# Patient Record
Sex: Male | Born: 1940 | Hispanic: No | Marital: Single | Smoking: Former smoker
Health system: Southern US, Community
[De-identification: ages and names within clinical notes are randomized; demographics above are authoritative.]

## PROBLEM LIST (undated history)

## (undated) DIAGNOSIS — E785 Hyperlipidemia, unspecified: Secondary | ICD-10-CM

## (undated) HISTORY — PX: INTRACAPSULAR CATARACT EXTRACTION: SHX361

---

## 2014-02-20 LAB — BASIC METABOLIC PANEL
BUN: 17 mg/dL (ref 4–21)
Creatinine: 1 mg/dL (ref <=1.3)

## 2014-02-20 LAB — LIPID PANEL
Cholesterol: 234 mg/dL — AB (ref 0–200)
HDL: 71 mg/dL — AB (ref 35–70)
LDL Cholesterol: 124 mg/dL
Triglycerides: 193 mg/dL — AB (ref 40–160)

## 2014-03-13 ENCOUNTER — Ambulatory Visit: Payer: Self-pay

## 2014-09-17 ENCOUNTER — Encounter: Payer: Self-pay | Admitting: Internal Medicine

## 2014-09-17 DIAGNOSIS — M19079 Primary osteoarthritis, unspecified ankle and foot: Secondary | ICD-10-CM | POA: Insufficient documentation

## 2014-09-17 DIAGNOSIS — E782 Mixed hyperlipidemia: Secondary | ICD-10-CM | POA: Insufficient documentation

## 2014-09-17 DIAGNOSIS — F17200 Nicotine dependence, unspecified, uncomplicated: Secondary | ICD-10-CM | POA: Insufficient documentation

## 2014-09-17 DIAGNOSIS — K589 Irritable bowel syndrome without diarrhea: Secondary | ICD-10-CM | POA: Insufficient documentation

## 2014-09-20 ENCOUNTER — Other Ambulatory Visit: Payer: Self-pay | Admitting: Internal Medicine

## 2014-09-20 DIAGNOSIS — R1011 Right upper quadrant pain: Secondary | ICD-10-CM

## 2014-09-26 ENCOUNTER — Ambulatory Visit: Payer: Self-pay

## 2014-10-03 ENCOUNTER — Ambulatory Visit: Payer: Medicaid Other

## 2014-10-10 ENCOUNTER — Ambulatory Visit
Admission: RE | Admit: 2014-10-10 | Discharge: 2014-10-10 | Disposition: A | Discharge status: Home / Self Care | Payer: Medicaid Other | Source: Ambulatory Visit | Attending: Internal Medicine | Admitting: Internal Medicine

## 2014-10-10 DIAGNOSIS — N200 Calculus of kidney: Secondary | ICD-10-CM | POA: Diagnosis not present

## 2014-10-10 DIAGNOSIS — R1011 Right upper quadrant pain: Secondary | ICD-10-CM

## 2014-10-10 DIAGNOSIS — K760 Fatty (change of) liver, not elsewhere classified: Secondary | ICD-10-CM | POA: Diagnosis not present

## 2015-01-29 ENCOUNTER — Other Ambulatory Visit: Payer: Self-pay | Admitting: Internal Medicine

## 2015-03-09 ENCOUNTER — Other Ambulatory Visit: Payer: Self-pay | Admitting: Internal Medicine

## 2015-03-23 ENCOUNTER — Ambulatory Visit (INDEPENDENT_AMBULATORY_CARE_PROVIDER_SITE_OTHER): Payer: Medicaid Other | Admitting: Internal Medicine

## 2015-03-23 ENCOUNTER — Encounter: Payer: Self-pay | Admitting: Internal Medicine

## 2015-03-23 VITALS — BP 128/80 | HR 68 | Ht 65.0 in | Wt 162.8 lb

## 2015-03-23 DIAGNOSIS — M19072 Primary osteoarthritis, left ankle and foot: Secondary | ICD-10-CM

## 2015-03-23 DIAGNOSIS — K589 Irritable bowel syndrome without diarrhea: Secondary | ICD-10-CM

## 2015-03-23 DIAGNOSIS — J302 Other seasonal allergic rhinitis: Secondary | ICD-10-CM | POA: Diagnosis not present

## 2015-03-23 DIAGNOSIS — G44219 Episodic tension-type headache, not intractable: Secondary | ICD-10-CM | POA: Diagnosis not present

## 2015-03-23 DIAGNOSIS — Z125 Encounter for screening for malignant neoplasm of prostate: Secondary | ICD-10-CM | POA: Diagnosis not present

## 2015-03-23 DIAGNOSIS — E782 Mixed hyperlipidemia: Secondary | ICD-10-CM

## 2015-03-23 DIAGNOSIS — F17200 Nicotine dependence, unspecified, uncomplicated: Secondary | ICD-10-CM | POA: Diagnosis not present

## 2015-03-23 MED ORDER — PRAVASTATIN SODIUM 40 MG PO TABS: 40.0000 mg | ORAL_TABLET | Freq: Every day | ORAL | Status: DC

## 2015-03-23 MED ORDER — CETIRIZINE HCL 10 MG PO TABS: 10.0000 mg | ORAL_TABLET | Freq: Every day | ORAL | Status: DC

## 2015-03-23 NOTE — Progress Notes (Signed)
Date:  03/23/2015   Name:  Jeffrey Gill   DOB:  05/03/1941   MRN:  [id number]   Chief Complaint: Hyperlipidemia Hyperlipidemia This is a chronic problem. The current episode started more than 1 year ago. The problem is controlled. Recent lipid tests were reviewed and are normal. There are no known factors aggravating his hyperlipidemia. Pertinent negatives include no chest pain or shortness of breath. Current antihyperlipidemic treatment includes statins. The current treatment provides significant improvement of lipids. There are no compliance problems.   Ankle Pain  The incident occurred more than 1 week ago. The injury mechanism was a twisting injury. The quality of the pain is described as aching. The pain has been intermittent since onset. Pertinent negatives include no numbness. The symptoms are aggravated by weight bearing.  Allergic rhinitis - He complains of intermittent runny nose that he says his allergies. Symptoms are usually worse in the spring and fall. He uses over-the-counter Zantac but is requesting a prescription since he has Medicaid. Headache - patient describes intermittent temporal headache that is relieved by Tylenol. He's never had headaches in the past and these are not severe. They started gradually and then get slowly worse. He's had no persistent headache, nausea, visual change, or dizziness. Flank pain - Patient describes right sided muscular-type discomfort. It started after he lifted a heavy trunk several months ago. It is not painful to palpation and he denies any change in his urine with regard to color or blood. He has not taken any specific therapy for this. Irritable bowel syndrome - patient takes Levsin as needed for IBS symptoms. He denies vomiting, diarrhea, or blood in his stool. His appetite is stable.  Review of Systems  Constitutional: Negative for fever, chills and fatigue.  HENT: Positive for postnasal drip, rhinorrhea and sinus pressure.  Negative for sore throat and trouble swallowing.   Eyes: Negative for visual disturbance.  Respiratory: Negative for cough, chest tightness and shortness of breath.   Cardiovascular: Negative for chest pain, palpitations and leg swelling.  Gastrointestinal: Positive for abdominal pain (intermittent IBS).  Genitourinary: Negative for dysuria, urgency, frequency and hematuria.  Musculoskeletal: Positive for back pain (right flank pain).  Neurological: Positive for headaches (tension type). Negative for speech difficulty, weakness, light-headedness and numbness.  Hematological: Negative for adenopathy.  Psychiatric/Behavioral: Negative for confusion.    Patient Active Problem List   Diagnosis Date Noted  . 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 Hyperlipidemia, mixed 09/17/2014  . 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 Compulsive tobacco user syndrome 09/17/2014    Prior to Admission medications   Medication Sig Start Date End Date Taking? Authorizing Provider  hyoscyamine (ANASPAZ) 0.125 MG TBDP disintergrating tablet Take 1 tablet by mouth 2 (two) times daily as needed.   Yes Historical Provider, MD  Multiple Vitamins-Minerals (MULTIVITAMIN ADULTS 50+) TABS Take 1 tablet by mouth daily.   Yes Historical Provider, MD  Omega-3 Fatty Acids (FISH OIL BURP-LESS) 1200 MG CAPS Take 1 capsule by mouth daily.   Yes Historical Provider, MD  pravastatin (PRAVACHOL) 40 MG tablet TAKE 1 TABLET BY MOUTH EVERY DAY. 03/10/15  Yes Reubin MilanLaura H Detra Bores, MD  etodolac (LODINE) 500 MG tablet Take 1 tablet by mouth daily.    Historical Provider, MD    No Known Allergies  Past Surgical History  Procedure Laterality Date  . Intracapsular cataract extraction Bilateral     Social History  Substance Use Topics  . Smoking status: Current Every Day Smoker  .  Smokeless tobacco: None  . Alcohol Use: 0.0 oz/week    0 Standard drinks or equivalent per week     Medication list has been  reviewed and updated.   Physical Exam  Constitutional: He is oriented to person, place, and time. He appears well-developed. No distress.  HENT:  Head: Normocephalic and atraumatic.  Right Ear: Tympanic membrane and ear canal normal.  Left Ear: Tympanic membrane and ear canal normal.  Nose: Right sinus exhibits no maxillary sinus tenderness and no frontal sinus tenderness. Left sinus exhibits no maxillary sinus tenderness and no frontal sinus tenderness.  Mouth/Throat: Oropharynx is clear and moist.  Eyes: Conjunctivae are normal. Right eye exhibits no discharge. Left eye exhibits no discharge. No scleral icterus.  Neck: Normal range of motion. Neck supple. Carotid bruit is not present. No thyromegaly present.  Cardiovascular: Normal rate, regular rhythm, normal heart sounds and normal pulses.   Pulmonary/Chest: Effort normal and breath sounds normal. No respiratory distress.  Abdominal: Soft. Normal appearance and bowel sounds are normal. There is no tenderness. There is CVA tenderness (tender over the right flank - muscular type discomfort with movement).  Musculoskeletal: Normal range of motion.  No temporal tenderness or cord noted  Lymphadenopathy:    He has no cervical adenopathy.  Neurological: He is alert and oriented to person, place, and time.  Skin: Skin is warm and dry. No rash noted.  Psychiatric: He has a normal mood and affect. His behavior is normal. Thought content normal.  Nursing note and vitals reviewed.   BP 128/80 mmHg  Pulse 68  Ht  (1.651 m)  Wt 162 lb 12.8 oz (73.846 kg)  BMI 27.09 kg/m2  Assessment and Plan: 1. Hyperlipidemia, mixed Continue current therapy - pravastatin (PRAVACHOL) 40 MG tablet; Take 1 tablet (40 mg total) by mouth daily.  Dispense: 30 tablet; Refill: 5 - Comprehensive metabolic panel - Lipid panel  2. 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 Osteoarthritis of left ankle, unspecified osteoarthritis type Tylenol as needed  4. Compulsive tobacco user syndrome he continues to smoke a half a pack of cigarettes per day and has no interest in quitting  5. Other seasonal allergic rhinitis Continue Zyrtec when necessary - cetirizine (ZYRTEC) 10 MG tablet; Take 1 tablet (10 mg total) by mouth daily.  Dispense: 30 tablet; Refill: 11  6. Prostate cancer screening DRE deferred - PSA  7. Episodic tension-type headache, not intractable Recommended Tylenol as needed Return for further evaluation if persistent   Bari Edward, MD [facility]  03/23/2015

## 2015-03-24 LAB — COMPREHENSIVE METABOLIC PANEL
ALT: 22 IU/L (ref 0–44)
AST: 24 IU/L (ref 0–40)
Albumin/Globulin Ratio: 1.7 (ref 1.1–2.5)
Albumin: 4.3 g/dL (ref 3.5–4.8)
Alkaline Phosphatase: 66 IU/L (ref 39–117)
BILIRUBIN TOTAL: 0.5 mg/dL (ref 0.0–1.2)
BUN / CREAT RATIO: 12 (ref 10–22)
BUN: 14 mg/dL (ref 8–27)
CHLORIDE: 101 mmol/L (ref 97–106)
CO2: 24 mmol/L (ref 18–29)
Calcium: 9.4 mg/dL (ref 8.6–10.2)
Creatinine, Ser: 1.13 mg/dL (ref 0.76–1.27)
GFR calc Af Amer: 75 mL/min/{1.73_m2} (ref >59)
GFR calc non Af Amer: 65 mL/min/{1.73_m2} (ref >59)
GLUCOSE: 91 mg/dL (ref 65–99)
Globulin, Total: 2.6 g/dL (ref 1.5–4.5)
Potassium: 4.6 mmol/L (ref 3.5–5.2)
Sodium: 141 mmol/L (ref 136–144)
Total Protein: 6.9 g/dL (ref 6.0–8.5)

## 2015-03-24 LAB — CBC WITH DIFFERENTIAL/PLATELET
Basophils Absolute: 0 10*3/uL (ref 0.0–0.2)
Basos: 1 %
EOS (ABSOLUTE): 0.2 10*3/uL (ref 0.0–0.4)
EOS: 4 %
HEMATOCRIT: 42.8 % (ref 37.5–51.0)
Hemoglobin: 14.4 g/dL (ref 12.6–17.7)
Immature Grans (Abs): 0 10*3/uL (ref 0.0–0.1)
Immature Granulocytes: 0 %
LYMPHS ABS: 2.1 10*3/uL (ref 0.7–3.1)
Lymphs: 34 %
MCH: 33 pg (ref 26.6–33.0)
MCHC: 33.6 g/dL (ref 31.5–35.7)
MCV: 98 fL — ABNORMAL HIGH (ref 79–97)
MONOS ABS: 0.3 10*3/uL (ref 0.1–0.9)
Monocytes: 5 %
Neutrophils Absolute: 3.5 10*3/uL (ref 1.4–7.0)
Neutrophils: 56 %
PLATELETS: 189 10*3/uL (ref 150–379)
RBC: 4.37 x10E6/uL (ref 4.14–5.80)
RDW: 14.3 % (ref 12.3–15.4)
WBC: 6.2 10*3/uL (ref 3.4–10.8)

## 2015-03-24 LAB — LIPID PANEL
CHOLESTEROL TOTAL: 229 mg/dL — AB (ref 100–199)
Chol/HDL Ratio: 4 ratio units (ref 0.0–5.0)
HDL: 57 mg/dL (ref >39)
LDL Calculated: 123 mg/dL — ABNORMAL HIGH (ref 0–99)
TRIGLYCERIDES: 247 mg/dL — AB (ref 0–149)
VLDL CHOLESTEROL CAL: 49 mg/dL — AB (ref 5–40)

## 2015-03-24 LAB — PSA: Prostate Specific Ag, Serum: 2.2 ng/mL (ref 0.0–4.0)

## 2015-07-19 ENCOUNTER — Encounter: Payer: Self-pay | Admitting: Family Medicine

## 2015-07-19 ENCOUNTER — Ambulatory Visit (INDEPENDENT_AMBULATORY_CARE_PROVIDER_SITE_OTHER): Payer: Medicaid Other | Admitting: Family Medicine

## 2015-07-19 VITALS — BP 130/80 | HR 72 | Ht 65.0 in | Wt 166.0 lb

## 2015-07-19 DIAGNOSIS — M779 Enthesopathy, unspecified: Principal | ICD-10-CM

## 2015-07-19 DIAGNOSIS — M778 Other enthesopathies, not elsewhere classified: Secondary | ICD-10-CM | POA: Diagnosis not present

## 2015-07-19 MED ORDER — ETODOLAC 500 MG PO TABS: 500.0000 mg | ORAL_TABLET | Freq: Two times a day (BID) | ORAL | Status: DC

## 2015-07-19 NOTE — Progress Notes (Signed)
Name: Jeffrey Gill   MRN: [id number]    DOB: 1941-03-18   Date:07/19/2015       Progress Note  Subjective  Chief Complaint  Chief Complaint  Patient presents with  . Arm Pain    R) arm pain- been hurting for approx a week- worse last night, unable to sleep- feels like a spasm on back side of arm    Arm Pain  The incident occurred more than 1 week ago (no injury). The quality of the pain is described as aching. The pain is at a severity of 6/10. The pain has been fluctuating since the incident. Pertinent negatives include no chest pain or tingling.    No problem-specific assessment & plan notes found for this encounter.   History reviewed. No pertinent past medical history.  Past Surgical History  Procedure Laterality Date  . Intracapsular cataract extraction Bilateral     History reviewed. No pertinent family history.  Social History   Social History  . Marital Status: Single    Spouse Name: N/A  . Number of Children: N/A  . Years of Education: N/A   Occupational History  . Not on file.   Social History Main Topics  . Smoking status: Current Every Day Smoker  . Smokeless tobacco: Not on file  . Alcohol Use: 0.0 oz/week    0 Standard drinks or equivalent per week  . Drug Use: No  . Sexual Activity: Not on file   Other Topics Concern  . Not on file   Social History Narrative    No Known Allergies   Review of Systems  Constitutional: Negative for fever, chills, weight loss and malaise/fatigue.  HENT: Negative for ear discharge, ear pain and sore throat.   Eyes: Negative for blurred vision.  Respiratory: Negative for cough, sputum production, shortness of breath and wheezing.   Cardiovascular: Negative for chest pain, palpitations and leg swelling.  Gastrointestinal: Negative for heartburn, nausea, abdominal pain, diarrhea, constipation, blood in stool and melena.  Genitourinary: Negative for dysuria, urgency, frequency and hematuria.   Musculoskeletal: Negative for myalgias, back pain, joint pain and neck pain.  Skin: Negative for rash.  Neurological: Negative for dizziness, tingling, sensory change, focal weakness and headaches.  Endo/Heme/Allergies: Negative for environmental allergies and polydipsia. Does not bruise/bleed easily.  Psychiatric/Behavioral: Negative for depression and suicidal ideas. The patient is not nervous/anxious and does not have insomnia.      Objective  Filed Vitals:   07/19/15 1102  BP: 130/80  Pulse: 72  Height:  (1.651 m)  Weight: 166 lb (75.297 kg)    Physical Exam  Constitutional: He is oriented to person, place, and time and well-developed, well-nourished, and in no distress.  HENT:  Head: Normocephalic.  Right Ear: External ear normal.  Left Ear: External ear normal.  Nose: Nose normal.  Mouth/Throat: Oropharynx is clear and moist.  Eyes: Conjunctivae and EOM are normal. Pupils are equal, round, and reactive to light. Right eye exhibits no discharge. Left eye exhibits no discharge. No scleral icterus.  Neck: Normal range of motion. Neck supple. No JVD present. No tracheal deviation present. No thyromegaly present.  Cardiovascular: Normal rate, regular rhythm, normal heart sounds and intact distal pulses.  Exam reveals no gallop and no friction rub.   No murmur heard. Pulmonary/Chest: Breath sounds normal. No respiratory distress. He has no wheezes. He has no rales.  Abdominal: Soft. Bowel sounds are normal. He exhibits no mass. There is no hepatosplenomegaly. There is no tenderness.  There is no rebound, no guarding and no CVA tenderness.  Musculoskeletal: Normal range of motion. He exhibits no edema or tenderness.  Lymphadenopathy:    He has no cervical adenopathy.  Neurological: He is alert and oriented to person, place, and time. He has normal sensation, normal strength, normal reflexes and intact cranial nerves. No cranial nerve deficit.  Skin: Skin is warm. No rash  noted.  Psychiatric: Mood and affect normal.  Nursing note and vitals reviewed.     Assessment & Plan  Problem List Items Addressed This Visit    None    Visit Diagnoses    Triceps tendonitis    -  Primary    possible partial tear    Relevant Medications    etodolac (LODINE) 500 MG tablet    Other Relevant Orders    Ambulatory referral to Physical Therapy         Dr. Elizabeth Sauereanna [facility]icy [facility]  07/19/2015

## 2015-07-19 NOTE — Patient Instructions (Addendum)
Triceps Tendinitis with Rehab Triceps tendinitis usually results in a ligament sprain (tear). The triceps tendon attaches the elbow to the triceps muscle on the back of the arm. It prevents the elbow from bending too far outward. Sprains are classified into three categories. Grade 1 sprains cause pain, but the tendon is not lengthened. Grade 2 sprains include a lengthened ligament due to the ligament being stretched or partially ruptured. With grade 2 sprains there is still function, although the function may be diminished. Grade 3 sprains are characterized by a complete tear of the tendon or muscle and function is usually impaired. SYMPTOMS   Pain, tenderness, swelling, and/or bruising over the site of injury (contusion).  Pain that worsens with elbow movement, such as push-ups.  A "pop" or tear felt or heard at the time of injury.  Decreased elbow function and/or grip strength. CAUSES   Overuse of triceps muscles and tendons.  Injury, laceration or direct blow to the triceps tendon. RISK INCREASES WITH:  Activities in which falling is likely.  Weightlifting and push-ups.  Poor strength and flexibility.  Steroid use. PREVENTION  Warm up and stretch properly before activity.  Maintain physical fitness:  Strength, flexibility, and endurance.  Cardiovascular fitness.  Learn and use proper technique. When possible, have coach correct improper technique.  Functional braces may be effective in preventing injury, especially re-injury, in contact sports. PROGNOSIS  Triceps sprains usually heal in 6 weeks with proper treatment and rest. RELATED COMPLICATIONS  Tendon rupture requiring surgery.  Loss of motion in the elbow  Prolonged healing time, if improperly treated or re-injured. TREATMENT  Treatment initially involves resting from any activities that aggravate the symptoms, and the use of ice and medications to help reduce pain and inflammation. Referral to a therapist for  further evaluation and treatment may be enough for a full recovery. If rehabilitation alone is insufficient for resolving the injury, then surgery may be necessary to use other tissue to recreate (reconstruct) the torn tendon. After surgery, immobilization of the elbow is necessary to allow for healing. After immobilization it is important to perform strengthening and stretching exercises to help regain strength and a full range of motion. These exercises may be completed at home or with a therapist. Your therapist will decide when you may return to sports. MEDICATION   If pain medication is necessary, then nonsteroidal anti-inflammatory medications, such as aspirin and ibuprofen, or other minor pain relievers, such as acetaminophen, are often recommended.  Do not take pain medication for 7 days before surgery.  Prescription pain relievers may be given if deemed necessary by your caregiver. Use only as directed and only as much as you need. HEAT AND COLD  Cold treatment (icing) relieves pain and reduces inflammation. Cold treatment should be applied for 10 to 15 minutes every 2 to 3 hours for inflammation and pain and immediately after any activity that aggravates your symptoms. Use ice packs or massage the area with a piece of ice (ice massage).  Heat treatment may be used prior to performing the stretching and strengthening activities prescribed by your caregiver, physical therapist, or athletic trainer. Use a heat pack or soak your injury in warm water. SEEK MEDICAL CARE IF:  Treatment seems to offer no benefit, or the condition worsens.  Any medications produce adverse side effects.  Any complications from surgery occur:  Pain, numbness, or coldness in the extremity operated upon.  Discoloration of the nail beds (they become blue or gray) of the extremity operated upon.  Signs of infections (fever, pain, inflammation, redness, or persistent bleeding). EXERCISES RANGE OF MOTION (ROM)  AND STRETCHING EXERCISES - Triceps Tendinitis These exercises may help you when beginning to rehabilitate your injury. Your symptoms may resolve with or without further involvement from your physician, physical therapist or athletic trainer. While completing these exercises, remember:   Restoring tissue flexibility helps normal motion to return to the joints. This allows healthier, less painful movement and activity.  An effective stretch should be held for at least 30 seconds.  A stretch should never be painful. You should only feel a gentle lengthening or release in the stretched tissue. RANGE OF MOTION - Flexion  Hold your right / left arm at your side and bend your elbow as far as you can using your right / left arm muscles.  Bend the right / left elbow farther by gently pushing up on your forearm until you feel a gentle stretch on the outside of your elbow. Hold this position for __________ seconds.  Slowly return to the starting position. Repeat __________ times. Complete this exercise __________ times per day.  RANGE OF MOTION - Elbow Flexion, Supine   Lie on your back. Extend your right / left arm into the air, bracing it with your opposite hand. Allow your right / left arm to relax.  Let your elbow bend, allowing your hand fall slowly toward your chest.  You should feel a gentle stretch along the back of your upper arm and/or elbow. Your physician, physical therapist or athletic trainer may ask you to hold a __________ hand weight to increase the intensity of this stretch.  Hold for __________ seconds. Slowly return your right / left arm to the upright position. Repeat __________ times. Complete this exercise __________ times per day. STRETCH - Elbow Flexors   Lie on a firm bed or countertop on your back. Be sure that you are in a comfortable position which will allow you to relax your arm muscles.  Place a folded towel under your upper arm so that your elbow and shoulder are  at the same height. Extend your arm; your elbow should not rest on the bed or towel  Allow the weight of your hand to straighten your elbow. Keep your arm and chest muscles relaxed. Your caretaker may ask you to increase the intensity of your stretch by adding a small wrist or hand weight.  Hold for __________ seconds. You should feel a stretch on the inside of your elbow. Slowly return to the starting position. Repeat __________ times. Complete this exercise __________ times per day. STRENGTHENING EXERCISES - Triceps Tendinitis These exercises will help you regain your strength. These exercises may resolve your symptoms with or without further involvement from your physician, physical therapist or athletic trainer. While completing these exercises, remember:   Muscles can gain both the endurance and the strength needed for everyday activities through controlled exercises.  Complete these exercises as instructed by your physician, physical therapist or athletic trainer. Progress with the resistance and repetition exercises only as your caregiver advises.  You may experience muscle soreness or fatigue, but the pain or discomfort you are trying to eliminate should never worsen during these exercises. If this pain does worsen, stop and make certain you are following the directions exactly. If the pain is still present after adjustments, discontinue the exercise until you can discuss the trouble with your clinician. STRENGTH - Elbow Extensors, Isometric  Stand or sit upright on a firm surface. Place your right /  left arm so that your palm faces your abdomen and it is at the height of your waist.  Place your opposite hand on the underside of your forearm. Gently push up as your right / left arm resists. Push as hard as you can with both arms without causing any pain or movement at your right / left elbow. Hold this stationary position for __________ seconds.  Gradually release the tension in both  arms. Allow your muscles to relax completely before repeating. Repeat __________ times. Complete this exercise __________ times per day. STRENGTH - Elbow Flexors, Supinated  With good posture, stand or sit on a firm chair without armrests. Allow your right / left arm to rest at your side with your palm facing forward.  Holding a __________ weight or gripping a rubber exercise band/tubing, bring your hand toward your shoulder.  Allow your muscles to control the resistance as your hand returns to your side. Repeat __________ times. Complete this exercise __________ times per day.  STRENGTH - Elbow Flexors, Neutral  With good posture, stand or sit on a firm chair without armrests. Allow your right / left arm to rest at your side with your thumb facing forward.  Holding a __________ weight or gripping a rubber exercise band/tubing, bring your hand toward your shoulder.  Allow your muscles to control the resistance as your hand returns to your side. Repeat __________ times. Complete this exercise __________ times per day.  STRENGTH - Elbow Extensors  Lie on your back. Extend your right / left elbow into the air, pointing it toward the ceiling. Brace your arm with your opposite hand.*  Holding a __________ weight in your hand, slowly straighten your right / left elbow.  Allow your muscles to control the weight as your hand returns to its starting position. Repeat __________ times. Complete this exercise __________ times per day. *You may also stand with your elbow overhead and pointed toward the ceiling and supported by your opposite hand. STRENGTH - Elbow Extensors, Dynamic  With good posture, stand or sit on a firm chair without armrests. Keeping your upper arms at your side, bring both hands up to your right / left shoulder while gripping a rubber exercise band/tubing. Your right / left hand should be just below the other hand.  Straighten your right / left elbow. Hold for __________  seconds.  Allow your muscles to control the rubber exercise band/tubing as your hand returns to your shoulder. Repeat __________ times. Complete this exercise __________ times per day.   This information is not intended to replace advice given to you by your health care provider. Make sure you discuss any questions you have with your health care provider.   Document Released: 04/21/2005 Document Revised: 07/14/2011 Document Reviewed: 08/03/2008 [employer] Interactive Patient Education [employer]

## 2015-07-20 ENCOUNTER — Other Ambulatory Visit: Payer: Self-pay

## 2015-07-20 MED ORDER — IBUPROFEN 600 MG PO TABS: 600.0000 mg | ORAL_TABLET | Freq: Three times a day (TID) | ORAL | Status: DC | PRN

## 2015-08-16 ENCOUNTER — Ambulatory Visit (INDEPENDENT_AMBULATORY_CARE_PROVIDER_SITE_OTHER): Payer: Medicaid Other | Admitting: Internal Medicine

## 2015-08-16 ENCOUNTER — Encounter: Payer: Self-pay | Admitting: Internal Medicine

## 2015-08-16 ENCOUNTER — Other Ambulatory Visit: Payer: Self-pay | Admitting: Internal Medicine

## 2015-08-16 ENCOUNTER — Ambulatory Visit: Payer: Medicaid Other | Attending: Family Medicine

## 2015-08-16 ENCOUNTER — Encounter: Payer: Self-pay | Admitting: Physical Therapy

## 2015-08-16 VITALS — BP 162/74 | HR 76 | Temp 98.3°F | Resp 14 | Wt 163.0 lb

## 2015-08-16 DIAGNOSIS — M79621 Pain in right upper arm: Secondary | ICD-10-CM | POA: Diagnosis present

## 2015-08-16 DIAGNOSIS — M62838 Other muscle spasm: Secondary | ICD-10-CM | POA: Diagnosis not present

## 2015-08-16 DIAGNOSIS — R293 Abnormal posture: Secondary | ICD-10-CM | POA: Insufficient documentation

## 2015-08-16 DIAGNOSIS — M779 Enthesopathy, unspecified: Secondary | ICD-10-CM | POA: Diagnosis not present

## 2015-08-16 DIAGNOSIS — M6281 Muscle weakness (generalized): Secondary | ICD-10-CM | POA: Diagnosis not present

## 2015-08-16 DIAGNOSIS — M25511 Pain in right shoulder: Secondary | ICD-10-CM | POA: Insufficient documentation

## 2015-08-16 MED ORDER — BACLOFEN 10 MG PO TABS: 10.0000 mg | ORAL_TABLET | Freq: Three times a day (TID) | ORAL | Status: DC

## 2015-08-16 NOTE — Therapy (Signed)
[place] [address] [place], [place], [address] Phone: [phone]   Fax:  [phone]  Physical Therapy Evaluation  Patient Details  Name: Jeffrey CabotFarooq Abdulrazzaq Al Mafrachi MRN: [id number] Date of Birth: 08/19/1940 Referring Provider: Elizabeth SauerJones, Deanna  Encounter Date: 08/16/2015      PT End of Session - 08/16/15 1734    Visit Number 1   Number of Visits 1   PT Start Time 1517   PT Stop Time 1609   PT Time Calculation (min) 52 min   Activity Tolerance Patient tolerated treatment well   Behavior During Therapy Anxious      History reviewed. No pertinent past medical history.  Past Surgical History  Procedure Laterality Date  . Intracapsular cataract extraction Bilateral     There were no vitals filed for this visit.       Subjective Assessment - 08/16/15 1725    Subjective Pt reports that in 06/2015 he had onset of pain in the posteroinferior region of his R upper arm near his elbow.  Pt initially associated this pain with his sleeping positions, but as time progressed his pain worsened.  Pt's pain currently radiates from his posteroinferior R upper arm up to pt's post upper back in region of scapua.  Currently pt is unable to sleep d/t his pain stating that the pain wakes him up every 1-2 hours each night.  Pt reports that he is unable to hold "heavy things" d/t his pain.  Pt is very fearful because his pain is only becoming worse.  Pt rates his current pain at a 7/10, and reports that it is constantly at a 7/10.  Pt states that when he takes a bath/shower shortly after he gets out, his R upper arm in the region of his pain feels very cold but not when pt touches it.  Pt has been encouraged by his physician to apply warm things to his R upper arm for healing.     Limitations House hold activities   Patient Stated Goals Pt would like his pain to go away.    Currently in Pain? Yes   Pain Score 7    Pain Location Arm   Pain  Orientation Right   Pain Onset More than a month ago   Pain Frequency Constant      OBJECTIVE: Ther Ex: Pt educated and performed exercises within his HEP 3-8x each exercise including shoulder isometric flex/ext/abd/IR/ER, seated rows, standing shoulder scaption.  Pt was also given sidelying shoulder IR/ER as a progressive exercise, but this was not performed at this date.     Pt requires skilled PT services to improve functional strength, and decrease pain.  Pt tolerated his 1x evaluation well as evidenced by his active participation throughout the session.       PT Education - 08/16/15 1732    Education provided Yes   Education Details Pt educated on exercises within his HEP including shoulder isometrics, rows, sidelying shoulder ER/IR, and standing shoulder scaption.  Pt instructed to perform all exercises within his HEP in a pain free zone. Pt educated on the benefits of cryotherapy for his pain, and to avoid painful motions  to help decrease the inflammed area.     Person(s) Educated Patient   Methods Explanation;Demonstration;Tactile cues;Verbal cues;Handout   Comprehension Verbalized understanding;Returned demonstration;Verbal cues required             PT Long Term Goals - 08/17/15 1235    PT LONG TERM GOAL #  1   Title Pt will be independent with HEP in order to manage symptoms and return to full function   Time 1   Period Weeks   Status Achieved               Plan - 08/16/15 1735    Clinical Impression Statement Pt is a pleasant 75 year-old male referred for R tricep tedonitis. PT evaluation reveals minimal pain with palpation over distal and proximal tricep attachments. Minimal pain with resisted elbow extension and no pain with passive R elbow flexion. AROM of R elbow is full. Painless passive R shoulder flexion and abduction with minimal pain reported during active motion. No painful arc or catch. Negative drop arm. Weakness and pain with resisted R shoulder  flexion and abduction. Positive full and empty can testing of R shoulder. Pt with pain to palpation over posterior R shoulder near attachment of infraspinatus/teres minor on humerus. Pain and weakness with resisted R shoulder external rotation. Less pain and weakness with resisted R shoulder internal rotation. Pt reports weakness and pain while lifting objects with right shoulder. Reports RUE numbness along C5 dermatome with light touch. Negative spurlings and no history of concurrent neck pain. R arm pain follows referral pattern for infraspinatus. PT evaluation seems to be more consistent with rotator cuff pathology than tricep strain. Pt provided with home exercise program for gentle isometrics progressive to active strengthening for rotator cuff. Encouraged to follow-up with MD if symptoms do not improve. Pending clinical course pt may benefit from further diagnostic imaging if he fails conservative management. Unfortunately due to [facility] regulations, payor will only reimburse for evaluation. Pt encouraged to return for free screen in two weeks to asses progress.    Rehab Potential Fair   Clinical Impairments Affecting Rehab Potential Positive: motivation, Negative: no covered treatments.    PT Frequency One time visit   PT Duration --  1 week   PT Treatment/Interventions Moist Heat;Ultrasound;Iontophoresis /ml Dexamethasone;Electrical Stimulation;Cryotherapy;Therapeutic activities;Therapeutic exercise;Neuromuscular re-education;Patient/family education;Manual techniques   PT Next Visit Plan Discharge   PT Home Exercise Plan Pt given shoulder isometrics, rows, sidelying shoulder IR/ER, and standing shoulder scaption.    Consulted and Agree with Plan of Care Patient      Patient will benefit from skilled therapeutic intervention in order to improve the following deficits and impairments:  Decreased strength, Improper body mechanics, Pain, Postural dysfunction  Visit Diagnosis: Muscle weakness  (generalized) - Plan: PT plan of care cert/re-cert  Abnormal posture - Plan: PT plan of care cert/re-cert  Pain in right upper arm - Plan: PT plan of care cert/re-cert  Pain in right shoulder - Plan: PT plan of care cert/re-cert     Problem List Patient Active Problem List   Diagnosis Date Noted  . Other seasonal allergic rhinitis 03/23/2015  . Episodic tension-type headache, not intractable 03/23/2015  . 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 Hyperlipidemia, mixed 09/17/2014  . 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 Compulsive tobacco user syndrome 09/17/2014   This entire session was performed under direct supervision and direction of a licensed therapist/therapist assistant . I have personally read, edited and approve of the note as written.   Lissa Hoard SPT Lynnea Maizes PT, DPT   Huprich,Jason, DPT 08/17/2015, 12:37 PM  [facility] [address] [place], [place], [address] Phone: [phone]   Fax:  [phone]  Name: Vishal Sandlin MRN: [id number] Date of Birth: 1941-03-25

## 2015-08-16 NOTE — Patient Instructions (Signed)
Muscle Cramps and Spasms Muscle cramps and spasms are when muscles tighten by themselves. They usually get better within minutes. Muscle cramps are painful. They are usually stronger and last longer than muscle spasms. Muscle spasms may or may not be painful. They can last a few seconds or much longer. HOME CARE  Drink enough fluid to keep your pee (urine) clear or pale yellow.  Massage, stretch, and relax the muscle.  Use a warm towel, heating pad, or warm shower water on tight muscles.  Place ice on the muscle if it is tender or in pain.  Put ice in a plastic bag.  Place a towel between your skin and the bag.  Leave the ice on for 15-20 minutes, 03-04 times a day.  Only take medicine as told by your doctor. GET HELP RIGHT AWAY IF:  Your cramps or spasms get worse, happen more often, or do not get better with time. MAKE SURE YOU:  Understand these instructions.  Will watch your condition.  Will get help right away if you are not doing well or get worse.   This information is not intended to replace advice given to you by your health care provider. Make sure you discuss any questions you have with your health care provider.   Document Released: 04/03/2008 Document Revised: 08/16/2012 Document Reviewed: 04/07/2012 Elsevier Interactive Patient Education 2016 [employer] Inc[employer]  

## 2015-08-16 NOTE — Progress Notes (Signed)
Date:  08/16/2015   Name:  Jeffrey Gill   DOB:  [birth year]   MRN:  [id number]   Chief Complaint: Arm Pain Arm Pain  The incident occurred at home. There was no injury mechanism. The pain is present in the right shoulder and right elbow. The quality of the pain is described as aching (and cold sensation). Radiates to: upper back and trapezius muscle. The pain has been fluctuating since the incident. Pertinent negatives include no chest pain. Nothing aggravates the symptoms. He has tried NSAIDs for the symptoms. The treatment provided mild relief.  The discomfort started in the elbow and he was prescribed Advil.  Now the pain has moved to his shoulder and he has stiffness and spasm in his right upper back.    Review of Systems  Constitutional: Negative for fever, chills and fatigue.  Respiratory: Negative for chest tightness and shortness of breath.   Cardiovascular: Negative for chest pain.  Musculoskeletal: Positive for myalgias and arthralgias. Negative for neck pain and neck stiffness.  Skin: Negative for color change and rash.  Neurological: Negative for tremors and weakness.    Patient Active Problem List   Diagnosis Date Noted  . Other seasonal allergic rhinitis 03/23/2015  . Episodic tension-type headache, not intractable 03/23/2015  . 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 Hyperlipidemia, mixed 09/17/2014  . 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 Compulsive tobacco user syndrome 09/17/2014    Prior to Admission medications   Medication Sig Start Date End Date Taking? Authorizing Provider  ibuprofen (ADVIL,MOTRIN) 600 MG tablet Take 1 tablet (600 mg total) by mouth every 8 (eight) hours as needed. 07/20/15  Yes Duanne Limerick, MD  pravastatin (PRAVACHOL) 40 MG tablet Take 1 tablet (40 mg total) by mouth daily. 03/23/15  Yes Reubin Milan, MD  Multiple Vitamins-Minerals (MULTIVITAMIN ADULTS 50+) TABS Take 1 tablet by mouth daily.  Reported on 08/16/2015    Historical Provider, MD  Omega-3 Fatty Acids (FISH OIL BURP-LESS) 1200 MG CAPS Take 1 capsule by mouth daily. Reported on 08/16/2015    Historical Provider, MD    No Known Allergies  Past Surgical History  Procedure Laterality Date  . Intracapsular cataract extraction Bilateral     Social History  Substance Use Topics  . Smoking status: Current Every Day Smoker  . Smokeless tobacco: None  . Alcohol Use: 0.0 oz/week    0 Standard drinks or equivalent per week     Medication list has been reviewed and updated.   Physical Exam  Constitutional: He is oriented to person, place, and time. He appears well-developed and well-nourished.  Neck: Normal range of motion. Neck supple.  Cardiovascular: Normal rate, regular rhythm and normal heart sounds.   Pulmonary/Chest: Effort normal and breath sounds normal.  Musculoskeletal:       Right shoulder: He exhibits tenderness. He exhibits normal range of motion.       Right elbow: He exhibits normal range of motion, no swelling and no effusion.  Spasm and tenderness in right trapezius muscle.  Tender over posterior rotator cuff.  Neurological: He is alert and oriented to person, place, and time. He has normal strength and normal reflexes. No cranial nerve deficit or sensory deficit.  Nursing note and vitals reviewed.   BP 162/74 mmHg  Pulse 76  Temp(Src) 98.3 F (36.8 C)  Resp 14  Wt 163 lb (73.936 kg)  Assessment and Plan: 1. Muscle spasm of right shoulder Add baclofen - baclofen (  LIORESAL) 10 MG tablet; Take 1 tablet (10 mg total) by mouth 3 (three) times daily.  Dispense: 60 each; Refill: 0  2. Tendonitis Continue Advil - PTx appt today If not improved, will consider Xrays or Ortho referral   Bari EdwardLaura Mareesa Gathright, MD [facility]  08/16/2015

## 2015-09-05 ENCOUNTER — Other Ambulatory Visit: Payer: Self-pay | Admitting: Internal Medicine

## 2015-09-05 DIAGNOSIS — M779 Enthesopathy, unspecified: Principal | ICD-10-CM

## 2015-09-05 DIAGNOSIS — M778 Other enthesopathies, not elsewhere classified: Secondary | ICD-10-CM | POA: Insufficient documentation

## 2015-09-20 ENCOUNTER — Encounter: Payer: Self-pay | Admitting: Internal Medicine

## 2015-09-20 ENCOUNTER — Ambulatory Visit (INDEPENDENT_AMBULATORY_CARE_PROVIDER_SITE_OTHER): Payer: Medicaid Other | Admitting: Internal Medicine

## 2015-09-20 DIAGNOSIS — M778 Other enthesopathies, not elsewhere classified: Secondary | ICD-10-CM

## 2015-09-20 DIAGNOSIS — E782 Mixed hyperlipidemia: Secondary | ICD-10-CM | POA: Diagnosis not present

## 2015-09-20 DIAGNOSIS — M779 Enthesopathy, unspecified: Principal | ICD-10-CM

## 2015-09-20 DIAGNOSIS — F17200 Nicotine dependence, unspecified, uncomplicated: Secondary | ICD-10-CM

## 2015-09-20 MED ORDER — TRAMADOL HCL 50 MG PO TABS: 50.0000 mg | ORAL_TABLET | Freq: Every day | ORAL | Status: DC

## 2015-09-20 NOTE — Progress Notes (Signed)
Date:  09/20/2015   Name:  Jeffrey Gill   DOB:  11/10/1940   MRN:  [id number]   Chief Complaint: Arm Pain Hyperlipidemia This is a chronic problem. The current episode started more than 1 year ago. The problem is controlled. Recent lipid tests were reviewed and are normal. Associated symptoms include myalgias. Pertinent negatives include no chest pain, focal weakness, leg pain or shortness of breath. Current antihyperlipidemic treatment includes statins.  Arm Pain  The incident occurred more than 1 week ago. There was no injury mechanism. The quality of the pain is described as aching. Pertinent negatives include no chest pain or numbness.  He's been seen several times treated with anti-inflammatories and muscle relaxants. Referred to physical therapy as well as chiropractic care. There is been no benefit from any of these treatments.  He reports that his neck and upper back muscle spasm is essentially resolved however posterior right arm discomfort is worse with sharp holing squeezing pains keeping him from sleep.    Review of Systems  Constitutional: Negative for chills and fatigue.  Respiratory: Negative for shortness of breath.   Cardiovascular: Negative for chest pain.  Musculoskeletal: Positive for myalgias and arthralgias.  Neurological: Negative for focal weakness, weakness and numbness.    Patient Active Problem List   Diagnosis Date Noted  . Triceps tendonitis 09/05/2015  . Other seasonal allergic rhinitis 03/23/2015  . Episodic tension-type headache, not intractable 03/23/2015  . 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 Hyperlipidemia, mixed 09/17/2014  . 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 Compulsive tobacco user syndrome 09/17/2014    Prior to Admission medications   Medication Sig Start Date End Date Taking? Authorizing Provider  baclofen (LIORESAL) 10 MG tablet Take 1 tablet (10 mg total) by mouth 3 (three) times daily.  08/16/15   Reubin Milan, MD  ibuprofen (ADVIL,MOTRIN) 600 MG tablet Take 1 tablet (600 mg total) by mouth every 8 (eight) hours as needed. 07/20/15   Duanne Limerick, MD  Multiple Vitamins-Minerals (MULTIVITAMIN ADULTS 50+) TABS Take 1 tablet by mouth daily. Reported on 08/16/2015    Historical Provider, MD  Omega-3 Fatty Acids (FISH OIL BURP-LESS) 1200 MG CAPS Take 1 capsule by mouth daily. Reported on 08/16/2015    Historical Provider, MD  pravastatin (PRAVACHOL) 40 MG tablet Take 1 tablet (40 mg total) by mouth daily. 03/23/15   Reubin Milan, MD    No Known Allergies  Past Surgical History  Procedure Laterality Date  . Intracapsular cataract extraction Bilateral     Social History  Substance Use Topics  . Smoking status: Current Every Day Smoker  . Smokeless tobacco: None  . Alcohol Use: 0.0 oz/week    0 Standard drinks or equivalent per week    Medication list has been reviewed and updated.   Physical Exam  Constitutional: He is oriented to person, place, and time. He appears well-developed. No distress.  HENT:  Head: Normocephalic and atraumatic.  Pulmonary/Chest: Effort normal. No respiratory distress.  Musculoskeletal: Normal range of motion.       Right shoulder: Normal.  Tenderness to palpation and with movement of the posterior upper right arm.  No mass is noted.  Elbow ROM intact.  Mild tenderness over the medial epicondyle.  No swelling or redness noted.  Neurological: He is alert and oriented to person, place, and time.  Skin: Skin is warm and dry. No rash noted.  Psychiatric: He has a normal mood and affect. His  behavior is normal. Thought content normal.    BP 112/78 mmHg  Pulse 73  Resp 16  Ht 5\' 5"  (1.651 m)  Wt 164 lb (74.39 kg)  BMI 27.29 kg/m2  SpO2 96%  Assessment and Plan: 1. Triceps tendonitis Has not responded to chiropractic care - traMADol (ULTRAM) 50 MG tablet; Take 1-2 tablets (50-100 mg total) by mouth at bedtime.  Dispense: 60 tablet;  Refill: 0 - Ambulatory referral to Orthopedic Surgery  2. Hyperlipidemia, mixed On statin therapy  3. Compulsive tobacco user syndrome   Bari EdwardLaura Berglund, MD [facility]  09/20/2015

## 2015-11-07 ENCOUNTER — Other Ambulatory Visit: Payer: Self-pay | Admitting: Internal Medicine

## 2016-02-13 ENCOUNTER — Ambulatory Visit (INDEPENDENT_AMBULATORY_CARE_PROVIDER_SITE_OTHER): Payer: Medicaid Other

## 2016-02-13 DIAGNOSIS — Z23 Encounter for immunization: Secondary | ICD-10-CM

## 2016-03-03 ENCOUNTER — Other Ambulatory Visit: Payer: Self-pay | Admitting: Orthopedic Surgery

## 2016-03-03 DIAGNOSIS — M542 Cervicalgia: Secondary | ICD-10-CM

## 2016-03-03 DIAGNOSIS — M5412 Radiculopathy, cervical region: Secondary | ICD-10-CM

## 2016-03-03 DIAGNOSIS — M5030 Other cervical disc degeneration, unspecified cervical region: Secondary | ICD-10-CM

## 2016-03-19 ENCOUNTER — Ambulatory Visit
Admission: RE | Admit: 2016-03-19 | Discharge: 2016-03-19 | Disposition: A | Discharge status: Home / Self Care | Payer: Medicaid Other | Source: Ambulatory Visit | Attending: Orthopedic Surgery | Admitting: Orthopedic Surgery

## 2016-03-19 ENCOUNTER — Encounter (INDEPENDENT_AMBULATORY_CARE_PROVIDER_SITE_OTHER): Payer: Self-pay

## 2016-03-19 DIAGNOSIS — M5412 Radiculopathy, cervical region: Secondary | ICD-10-CM | POA: Insufficient documentation

## 2016-03-19 DIAGNOSIS — M47892 Other spondylosis, cervical region: Secondary | ICD-10-CM | POA: Diagnosis not present

## 2016-03-19 DIAGNOSIS — M5021 Other cervical disc displacement,  high cervical region: Secondary | ICD-10-CM | POA: Insufficient documentation

## 2016-03-19 DIAGNOSIS — M5030 Other cervical disc degeneration, unspecified cervical region: Secondary | ICD-10-CM

## 2016-03-19 DIAGNOSIS — M50323 Other cervical disc degeneration at C6-C7 level: Secondary | ICD-10-CM | POA: Insufficient documentation

## 2016-03-19 DIAGNOSIS — M542 Cervicalgia: Secondary | ICD-10-CM

## 2016-03-24 ENCOUNTER — Encounter: Payer: Self-pay | Admitting: Internal Medicine

## 2016-03-24 ENCOUNTER — Ambulatory Visit (INDEPENDENT_AMBULATORY_CARE_PROVIDER_SITE_OTHER): Payer: Medicaid Other | Admitting: Internal Medicine

## 2016-03-24 VITALS — BP 120/80 | HR 72 | Ht 65.0 in | Wt 160.0 lb

## 2016-03-24 DIAGNOSIS — Z79899 Other long term (current) drug therapy: Secondary | ICD-10-CM | POA: Diagnosis not present

## 2016-03-24 DIAGNOSIS — E782 Mixed hyperlipidemia: Secondary | ICD-10-CM

## 2016-03-24 DIAGNOSIS — M5030 Other cervical disc degeneration, unspecified cervical region: Secondary | ICD-10-CM

## 2016-03-24 DIAGNOSIS — N529 Male erectile dysfunction, unspecified: Secondary | ICD-10-CM

## 2016-03-24 MED ORDER — SILDENAFIL CITRATE 100 MG PO TABS: 50.0000 mg | ORAL_TABLET | Freq: Every day | ORAL | 0 refills | Status: DC | PRN

## 2016-03-24 NOTE — Progress Notes (Signed)
Date:  03/24/2016   Name:  Jeffrey Gill   DOB:  01/21/1941   MRN:  [id number]   Chief Complaint: Follow-up (shoulder pain- Dedra Skeensodd Mundy ordered an MRI on him on 11/15) Neck Pain   This is a chronic problem. The problem occurs constantly. The problem has been unchanged. The pain is same all the time. Associated symptoms include numbness (donw right arm to hand). Pertinent negatives include no chest pain or headaches. He has tried muscle relaxants and NSAIDs (gabapentin - MRI shows DDD cervical spin) for the symptoms.  Hyperlipidemia  This is a chronic problem. The problem is controlled. There are no known factors aggravating his hyperlipidemia. Pertinent negatives include no chest pain or shortness of breath. Current antihyperlipidemic treatment includes statins.  Erectile Dysfunction  This is a new problem. The current episode started more than 1 month ago. The problem has been waxing and waning since onset. The nature of his difficulty is maintaining erection. Pertinent negatives include no dysuria or hematuria.    Review of Systems  Constitutional: Negative for appetite change, fatigue and unexpected weight change.  Eyes: Negative for visual disturbance.  Respiratory: Negative for cough, shortness of breath and wheezing.   Cardiovascular: Negative for chest pain, palpitations and leg swelling.  Gastrointestinal: Negative for abdominal pain and blood in stool.  Endocrine: Negative for polydipsia and polyuria.  Genitourinary: Negative for dysuria, hematuria, penile pain and penile swelling.       Erectile dysfunction with most encounters  Musculoskeletal: Positive for arthralgias, neck pain and neck stiffness.  Skin: Negative for color change and rash.  Neurological: Positive for numbness (donw right arm to hand). Negative for tremors and headaches.  Psychiatric/Behavioral: Negative for dysphoric mood.    Patient Active Problem List   Diagnosis Date Noted  .  Degenerative disc disease, cervical 03/24/2016  . Triceps tendonitis 09/05/2015  . Other seasonal allergic rhinitis 03/23/2015  . Episodic tension-type headache, not intractable 03/23/2015  . 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 Hyperlipidemia, mixed 09/17/2014  . 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 Compulsive tobacco user syndrome 09/17/2014    Prior to Admission medications   Medication Sig Start Date End Date Taking? Authorizing Provider  gabapentin (NEURONTIN) 300 MG capsule Take 2 capsules by mouth at bedtime. 03/03/16 05/02/16 Yes Historical Provider, MD  gabapentin (NEURONTIN) 100 MG capsule Take 2 capsules by mouth at bedtime. 02/14/16   Historical Provider, MD  ibuprofen (ADVIL,MOTRIN) 600 MG tablet Take 1 tablet (600 mg total) by mouth every 8 (eight) hours as needed. 07/20/15   Duanne Limerickeanna C Jones, MD  Multiple Vitamins-Minerals (MULTIVITAMIN ADULTS 50+) TABS Take 1 tablet by mouth daily. Reported on 08/16/2015    Historical Provider, MD  Omega-3 Fatty Acids (FISH OIL BURP-LESS) 1200 MG CAPS Take 1 capsule by mouth daily. Reported on 08/16/2015    Historical Provider, MD  pravastatin (PRAVACHOL) 40 MG tablet TAKE 1 TABLET BY MOUTH ONCE DAILY 11/07/15   Reubin MilanLaura H Weslee Fogg, MD  traMADol (ULTRAM) 50 MG tablet Take 1-2 tablets (50-100 mg total) by mouth at bedtime. 09/20/15   Reubin MilanLaura H Ordell Prichett, MD    No Known Allergies  Past Surgical History:  Procedure Laterality Date  . INTRACAPSULAR CATARACT EXTRACTION Bilateral     Social History  Substance Use Topics  . Smoking status: Current Every Day Smoker  . Smokeless tobacco: Not on file  . Alcohol use 0.0 oz/week     Medication list has been reviewed and updated.  Physical Exam  Constitutional: He is oriented to person, place, and time. He appears well-developed. No distress.  HENT:  Head: Normocephalic and atraumatic.  Cardiovascular: Normal rate, regular rhythm and normal heart sounds.     Pulmonary/Chest: Effort normal and breath sounds normal. No respiratory distress.  Abdominal: Soft. Bowel sounds are normal. He exhibits no distension and no mass. There is no tenderness. There is no rebound and no guarding.  Musculoskeletal:       Cervical back: He exhibits decreased range of motion and tenderness.  Neurological: He is alert and oriented to person, place, and time. He has normal strength.  Reflex Scores:      Bicep reflexes are 1+ on the right side and 1+ on the left side. Skin: Skin is warm and dry. No rash noted.  Psychiatric: He has a normal mood and affect. His behavior is normal. Thought content normal.  Nursing note and vitals reviewed.   BP 120/80   Pulse 72   Ht 5\' 5"  (1.651 m)   Wt 160 lb (72.6 kg)   BMI 26.63 kg/m   Assessment and Plan: 1. Hyperlipidemia, mixed Continue pravachol - Lipid panel  2. Degenerative disc disease, cervical Follow up with Dedra Skeensodd Mundy Some improvement in symptoms with gabapentin and Advil (possibly tramadol)  3. Long term use of drug - CBC with Differential/Platelet - Comprehensive metabolic panel  4. Erectile dysfunction, unspecified erectile dysfunction type Samples of Viagra given - sildenafil (VIAGRA) 100 MG tablet; Take 0.5 tablets (50 mg total) by mouth daily as needed for erectile dysfunction.  Dispense: 4 tablet; Refill: 0   Bari EdwardLaura Arielys Wandersee, MD [facility]  03/24/2016

## 2016-03-25 LAB — CBC WITH DIFFERENTIAL/PLATELET
BASOS: 1 %
Basophils Absolute: 0 10*3/uL (ref 0.0–0.2)
EOS (ABSOLUTE): 0.2 10*3/uL (ref 0.0–0.4)
EOS: 3 %
HEMATOCRIT: 38.9 % (ref 37.5–51.0)
HEMOGLOBIN: 13.4 g/dL (ref 12.6–17.7)
IMMATURE GRANS (ABS): 0 10*3/uL (ref 0.0–0.1)
IMMATURE GRANULOCYTES: 0 %
LYMPHS: 32 %
Lymphocytes Absolute: 2.3 10*3/uL (ref 0.7–3.1)
MCH: 33.3 pg — AB (ref 26.6–33.0)
MCHC: 34.4 g/dL (ref 31.5–35.7)
MCV: 97 fL (ref 79–97)
Monocytes Absolute: 0.5 10*3/uL (ref 0.1–0.9)
Monocytes: 7 %
NEUTROS ABS: 4.1 10*3/uL (ref 1.4–7.0)
NEUTROS PCT: 57 %
Platelets: 183 10*3/uL (ref 150–379)
RBC: 4.03 x10E6/uL — ABNORMAL LOW (ref 4.14–5.80)
RDW: 14.5 % (ref 12.3–15.4)
WBC: 7.2 10*3/uL (ref 3.4–10.8)

## 2016-03-25 LAB — COMPREHENSIVE METABOLIC PANEL
A/G RATIO: 1.7 (ref 1.2–2.2)
ALBUMIN: 4.3 g/dL (ref 3.5–4.8)
ALT: 23 IU/L (ref 0–44)
AST: 23 IU/L (ref 0–40)
Alkaline Phosphatase: 63 IU/L (ref 39–117)
BILIRUBIN TOTAL: 0.6 mg/dL (ref 0.0–1.2)
BUN / CREAT RATIO: 16 (ref 10–24)
BUN: 15 mg/dL (ref 8–27)
CALCIUM: 9.1 mg/dL (ref 8.6–10.2)
CHLORIDE: 99 mmol/L (ref 96–106)
CO2: 24 mmol/L (ref 18–29)
Creatinine, Ser: 0.96 mg/dL (ref 0.76–1.27)
GFR, EST AFRICAN AMERICAN: 90 mL/min/{1.73_m2} (ref >59)
GFR, EST NON AFRICAN AMERICAN: 78 mL/min/{1.73_m2} (ref >59)
GLOBULIN, TOTAL: 2.5 g/dL (ref 1.5–4.5)
Glucose: 85 mg/dL (ref 65–99)
POTASSIUM: 4.7 mmol/L (ref 3.5–5.2)
SODIUM: 139 mmol/L (ref 134–144)
TOTAL PROTEIN: 6.8 g/dL (ref 6.0–8.5)

## 2016-03-25 LAB — LIPID PANEL
CHOL/HDL RATIO: 4 ratio (ref 0.0–5.0)
Cholesterol, Total: 213 mg/dL — ABNORMAL HIGH (ref 100–199)
HDL: 53 mg/dL (ref >39)
LDL Calculated: 97 mg/dL (ref 0–99)
Triglycerides: 317 mg/dL — ABNORMAL HIGH (ref 0–149)
VLDL Cholesterol Cal: 63 mg/dL — ABNORMAL HIGH (ref 5–40)

## 2016-03-31 ENCOUNTER — Ambulatory Visit (INDEPENDENT_AMBULATORY_CARE_PROVIDER_SITE_OTHER): Payer: Medicaid Other | Admitting: Internal Medicine

## 2016-03-31 ENCOUNTER — Encounter: Payer: Self-pay | Admitting: Internal Medicine

## 2016-03-31 VITALS — BP 153/98 | HR 74 | Resp 16 | Ht 65.0 in | Wt 169.4 lb

## 2016-03-31 DIAGNOSIS — N529 Male erectile dysfunction, unspecified: Secondary | ICD-10-CM | POA: Diagnosis not present

## 2016-03-31 DIAGNOSIS — M5030 Other cervical disc degeneration, unspecified cervical region: Secondary | ICD-10-CM | POA: Diagnosis not present

## 2016-03-31 DIAGNOSIS — Z23 Encounter for immunization: Secondary | ICD-10-CM

## 2016-03-31 MED ORDER — SILDENAFIL CITRATE 100 MG PO TABS: 50.0000 mg | ORAL_TABLET | Freq: Every day | ORAL | 5 refills | Status: DC | PRN

## 2016-03-31 NOTE — Patient Instructions (Addendum)
Pneumococcal Conjugate Vaccine (PCV13) What You Need to Know 1. Why get vaccinated? Vaccination can protect both children and adults from pneumococcal disease. Pneumococcal disease is caused by bacteria that can spread from person to person through close contact. It can cause ear infections, and it can also lead to more serious infections of the:  Lungs (pneumonia),  Blood (bacteremia), and  Covering of the brain and spinal cord (meningitis).  Pneumococcal pneumonia is most common among adults. Pneumococcal meningitis can cause deafness and brain damage, and it kills about 1 child in 10 who get it. Anyone can get pneumococcal disease, but children under 2 years of age and adults 65 years and older, people with certain medical conditions, and cigarette smokers are at the highest risk. Before there was a vaccine, the [place] saw:  more than 700 cases of meningitis,  about 13,000 blood infections,  about 5 million ear infections, and  about 200 deaths  in children under 5 each year from pneumococcal disease. Since vaccine became available, severe pneumococcal disease in these children has fallen by 88%. About 18,000 older adults die of pneumococcal disease each year in the [place]. Treatment of pneumococcal infections with penicillin and other drugs is not as effective as it used to be, because some strains of the disease have become resistant to these drugs. This makes prevention of the disease, through vaccination, even more important. 2. PCV13 vaccine Pneumococcal conjugate vaccine (called PCV13) protects against 13 types of pneumococcal bacteria. PCV13 is routinely given to children at 2, 4, 6, and 12-15 months of age. It is also recommended for children and adults 2 to 64 years of age with certain health conditions, and for all adults 65 years of age and older. Your doctor can give you details. 3. Some people should not get this vaccine Anyone who has ever had a  life-threatening allergic reaction to a dose of this vaccine, to an earlier pneumococcal vaccine called PCV7, or to any vaccine containing diphtheria toxoid (for example, DTaP), should not get PCV13. Anyone with a severe allergy to any component of PCV13 should not get the vaccine. Tell your doctor if the person being vaccinated has any severe allergies. If the person scheduled for vaccination is not feeling well, your healthcare provider might decide to reschedule the shot on another day. 4. Risks of a vaccine reaction With any medicine, including vaccines, there is a chance of reactions. These are usually mild and go away on their own, but serious reactions are also possible. Problems reported following PCV13 varied by age and dose in the series. The most common problems reported among children were:  About half became drowsy after the shot, had a temporary loss of appetite, or had redness or tenderness where the shot was given.  About 1 out of 3 had swelling where the shot was given.  About 1 out of 3 had a mild fever, and about 1 in 20 had a fever over 102.2F.  Up to about 8 out of 10 became fussy or irritable.  Adults have reported pain, redness, and swelling where the shot was given; also mild fever, fatigue, headache, chills, or muscle pain. Young children who get PCV13 along with inactivated flu vaccine at the same time may be at increased risk for seizures caused by fever. Ask your doctor for more information. Problems that could happen after any vaccine:  People sometimes faint after a medical procedure, including vaccination. Sitting or lying down for about 15 minutes can help prevent   fainting, and injuries caused by a fall. Tell your doctor if you feel dizzy, or have vision changes or ringing in the ears.  Some older children and adults get severe pain in the shoulder and have difficulty moving the arm where a shot was given. This happens very rarely.  Any medication can cause a  severe allergic reaction. Such reactions from a vaccine are very rare, estimated at about 1 in a million doses, and would happen within a few minutes to a few hours after the vaccination. As with any medicine, there is a very small chance of a vaccine causing a serious injury or death. The safety of vaccines is always being monitored. For more information, visit: www.cdc.gov/vaccinesafety/ 5. What if there is a serious reaction? What should I look for? Look for anything that concerns you, such as signs of a severe allergic reaction, very high fever, or unusual behavior. Signs of a severe allergic reaction can include hives, swelling of the face and throat, difficulty breathing, a fast heartbeat, dizziness, and weakness-usually within a few minutes to a few hours after the vaccination. What should I do?  If you think it is a severe allergic reaction or other emergency that can't wait, call 9-1-1 or get the person to the nearest hospital. Otherwise, call your doctor.  Reactions should be reported to the Vaccine Adverse Event Reporting System (VAERS). Your doctor should file this report, or you can do it yourself through the VAERS web site at www.vaers.hhs.gov, or by calling 1-800-822-7967. ? VAERS does not give medical advice. 6. The National Vaccine Injury Compensation Program The National Vaccine Injury Compensation Program (VICP) is a federal program that was created to compensate people who may have been injured by certain vaccines. Persons who believe they may have been injured by a vaccine can learn about the program and about filing a claim by calling 1-800-338-2382 or visiting the VICP website at www.hrsa.gov/vaccinecompensation. There is a time limit to file a claim for compensation. 7. How can I learn more?  Ask your healthcare provider. He or she can give you the vaccine package insert or suggest other sources of information.  Call your local or state health department.  Contact the  Centers for Disease Control and Prevention (CDC): ? Call 1-800-232-4636 (1-800-CDC-INFO) or ? Visit CDC's website at www.cdc.gov/vaccines Vaccine Information Statement, PCV13 Vaccine (03/09/2014) This information is not intended to replace advice given to you by your health care provider. Make sure you discuss any questions you have with your health care provider. Document Released: 02/16/2006 Document Revised: 01/10/2016 Document Reviewed: 01/10/2016 Elsevier Interactive Patient Education  2017 Elsevier Inc.  

## 2016-03-31 NOTE — Progress Notes (Signed)
Date:  03/31/2016   Name:  Jeffrey CabotFarooq Abdulrazzaq Al Gill   DOB:  02/24/1941   MRN:  [id number]   Chief Complaint: Neck Pain (Neurosurgeon recommending Cervical Surgery to fix C4 C5 Pinched nerve and bulging discs. )  Neck Pain   This is a chronic problem. The problem occurs daily. The problem has been unchanged. The pain is present in the occipital region. The quality of the pain is described as burning. Associated symptoms include headaches and numbness (right upper arm to elbow). Pertinent negatives include no chest pain. He has tried NSAIDs and muscle relaxants (Referred for ESI - pending) for the symptoms.  Erectile Dysfunction  This is a new problem. The current episode started more than 1 month ago. The nature of his difficulty is achieving erection and maintaining erection. Pertinent negatives include no dysuria or hematuria. Past treatments include sildenafil.     Review of Systems  Constitutional: Negative for appetite change, fatigue and unexpected weight change.  Eyes: Negative for visual disturbance.  Respiratory: Negative for cough, shortness of breath and wheezing.   Cardiovascular: Negative for chest pain, palpitations and leg swelling.  Gastrointestinal: Negative for abdominal pain and blood in stool.  Endocrine: Negative for polydipsia and polyuria.  Genitourinary: Negative for dysuria, hematuria, penile pain and penile swelling.       Erectile dysfunction with most encounters  Musculoskeletal: Positive for arthralgias, neck pain and neck stiffness.  Skin: Negative for color change and rash.  Neurological: Positive for numbness (right upper arm to elbow) and headaches. Negative for tremors.  Psychiatric/Behavioral: Negative for dysphoric mood.    Patient Active Problem List   Diagnosis Date Noted  . ED (erectile dysfunction) 03/31/2016  . Degenerative disc disease, cervical 03/24/2016  . Triceps tendonitis 09/05/2015  . Other seasonal allergic rhinitis  03/23/2015  . Episodic tension-type headache, not intractable 03/23/2015  . 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 Hyperlipidemia, mixed 09/17/2014  . 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 Compulsive tobacco user syndrome 09/17/2014    Prior to Admission medications   Medication Sig Start Date End Date Taking? Authorizing Provider  gabapentin (NEURONTIN) 100 MG capsule Take 2 capsules by mouth at bedtime. 02/14/16  Yes Historical Provider, MD  ibuprofen (ADVIL,MOTRIN) 600 MG tablet Take 1 tablet (600 mg total) by mouth every 8 (eight) hours as needed. 07/20/15  Yes Duanne Limerickeanna C Jones, MD  Multiple Vitamins-Minerals (MULTIVITAMIN ADULTS 50+) TABS Take 1 tablet by mouth daily. Reported on 08/16/2015   Yes Historical Provider, MD  Omega-3 Fatty Acids (FISH OIL BURP-LESS) 1200 MG CAPS Take 1 capsule by mouth daily. Reported on 08/16/2015   Yes Historical Provider, MD  pravastatin (PRAVACHOL) 40 MG tablet TAKE 1 TABLET BY MOUTH ONCE DAILY 11/07/15  Yes Reubin MilanLaura H Kristilyn Coltrane, MD  sildenafil (VIAGRA) 100 MG tablet Take 0.5 tablets (50 mg total) by mouth daily as needed for erectile dysfunction. 03/24/16  Yes Reubin MilanLaura H Larken Urias, MD    No Known Allergies  Past Surgical History:  Procedure Laterality Date  . INTRACAPSULAR CATARACT EXTRACTION Bilateral     Social History  Substance Use Topics  . Smoking status: Current Every Day Smoker  . Smokeless tobacco: Never Used  . Alcohol use 0.0 oz/week     Medication list has been reviewed and updated.   Physical Exam  Constitutional: He is oriented to person, place, and time. He appears well-developed. No distress.  HENT:  Head: Normocephalic and atraumatic.  Cardiovascular: Normal rate, regular rhythm and normal  heart sounds.   Pulmonary/Chest: Effort normal and breath sounds normal. No respiratory distress.  Musculoskeletal:       Right upper arm: He exhibits tenderness (and decreased sensation).  Neurological: He is alert and  oriented to person, place, and time.  Skin: Skin is warm and dry. No rash noted.  Psychiatric: He has a normal mood and affect. His behavior is normal. Thought content normal.  Nursing note and vitals reviewed.   BP (!) 153/98   Pulse 74   Resp 16   Ht 5\' 5"  (1.651 m)   Wt 169 lb 6.4 oz (76.8 kg)   SpO2 95%   BMI 28.19 kg/m   Assessment and Plan: 1. Degenerative disc disease, cervical Being referred for ESI I explained in detail to patient and significant other - will try ESI first , then consider surgery if that fails  2. Erectile dysfunction, unspecified erectile dysfunction type Did well with viagra - will refill - sildenafil (VIAGRA) 100 MG tablet; Take 0.5-1 tablets (50-100 mg total) by mouth daily as needed for erectile dysfunction.  Dispense: 6 tablet; Refill: 5  3. Need for pneumococcal vaccination - Pneumococcal conjugate vaccine 13-valent IM   Bari EdwardLaura Roger Kettles, MD [facility]  03/31/2016

## 2016-10-13 ENCOUNTER — Encounter: Payer: Self-pay | Admitting: Internal Medicine

## 2016-10-13 ENCOUNTER — Ambulatory Visit (INDEPENDENT_AMBULATORY_CARE_PROVIDER_SITE_OTHER): Payer: Medicaid Other | Admitting: Internal Medicine

## 2016-10-13 VITALS — BP 110/70 | HR 70 | Temp 98.2°F | Resp 17 | Ht 65.0 in | Wt 164.0 lb

## 2016-10-13 DIAGNOSIS — N529 Male erectile dysfunction, unspecified: Secondary | ICD-10-CM

## 2016-10-13 DIAGNOSIS — M5030 Other cervical disc degeneration, unspecified cervical region: Secondary | ICD-10-CM

## 2016-10-13 MED ORDER — TADALAFIL 20 MG PO TABS: 10.0000 mg | ORAL_TABLET | ORAL | 11 refills | Status: AC | PRN

## 2016-10-13 NOTE — Progress Notes (Signed)
Date:  10/13/2016   Name:  Jeffrey Gill   DOB:  09/20/1940   MRN:  [id number]   Chief Complaint: Arm Pain (onset 10 months/ right arm) Arm Pain   The pain is present in the upper right arm. The quality of the pain is described as aching. The pain radiates to the right neck. The pain is moderate. The pain has been fluctuating since the incident. Pertinent negatives include no chest pain, muscle weakness, numbness or tingling. He has tried NSAIDs (seen by Ortho - given tramadol and seen once by Dr. Yves Dillhasnis for ESI cervial spine that lasted on one week.) for the symptoms.  Erectile Dysfunction  This is a chronic problem. The problem is unchanged. The nature of his difficulty is achieving erection and maintaining erection. Pertinent negatives include no chills. Past treatments include sildenafil. The treatment provided significant (but too expensive so wants to try Cialis) relief.      Review of Systems  Constitutional: Negative for chills and fatigue.  Respiratory: Negative for shortness of breath.   Cardiovascular: Negative for chest pain.  Gastrointestinal: Negative for abdominal pain.  Musculoskeletal: Positive for arthralgias and neck pain.  Neurological: Negative for tingling, weakness and numbness.    Patient Active Problem List   Diagnosis Date Noted  . ED (erectile dysfunction) 03/31/2016  . Degenerative disc disease, cervical 03/24/2016  . Triceps tendonitis 09/05/2015  . Other seasonal allergic rhinitis 03/23/2015  . Episodic tension-type headache, not intractable 03/23/2015  . 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 Hyperlipidemia, mixed 09/17/2014  . 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 Compulsive tobacco user syndrome 09/17/2014    Prior to Admission medications   Medication Sig Start Date End Date Taking? Authorizing Provider  acetaminophen (TYLENOL) 650 MG CR tablet Take 650 mg by mouth every 8 (eight) hours as needed for  pain.   Yes [provider]  gabapentin (NEURONTIN) 100 MG capsule Take 2 capsules by mouth at bedtime. 02/14/16  Yes [provider]  Omega-3 Fatty Acids (FISH OIL BURP-LESS) 1200 MG CAPS Take 1 capsule by mouth daily. Reported on 08/16/2015   Yes [provider]  omeprazole (PRILOSEC) 10 MG capsule Take by mouth as needed. 09/23/10  Yes [provider]  pravastatin (PRAVACHOL) 40 MG tablet TAKE 1 TABLET BY MOUTH ONCE DAILY 11/07/15  Yes Reubin MilanBerglund, Tazia Illescas H, MD  sildenafil (VIAGRA) 100 MG tablet Take 0.5-1 tablets (50-100 mg total) by mouth daily as needed for erectile dysfunction. 03/31/16  Yes Reubin MilanBerglund, Tawnee Clegg H, MD  traMADol (ULTRAM) 50 MG tablet TAKE 1 TABLET BY MOUTH EVERY 6 HOURS AS NEEDED FOR PAIN 07/21/16  Yes [provider]  Multiple Vitamins-Minerals (MULTIVITAMIN ADULTS 50+) TABS Take 1 tablet by mouth daily. Reported on 08/16/2015    [provider]    No Known Allergies  Past Surgical History:  Procedure Laterality Date  . INTRACAPSULAR CATARACT EXTRACTION Bilateral     Social History  Substance Use Topics  . Smoking status: Current Every Day Smoker  . Smokeless tobacco: Never Used  . Alcohol use 0.0 oz/week     Medication list has been reviewed and updated.   Physical Exam  Cardiovascular: Normal rate, regular rhythm and normal heart sounds.   Pulmonary/Chest: Effort normal and breath sounds normal. No respiratory distress. He has no wheezes. He has no rhonchi.  Musculoskeletal:       Cervical back: He exhibits decreased range of motion and tenderness.  Right upper arm: He exhibits tenderness. He exhibits no edema and no deformity.  Neurological: He has normal strength. No sensory deficit.  Reflex Scores:      Bicep reflexes are 2+ on the right side and 2+ on the left side.   BP 110/70 (BP Location: Right Arm, Patient Position: Sitting, Cuff Size: Normal)   Pulse 70   Temp 98.2 F (36.8 C) (Oral)   Resp 17    Ht 5\' 5"  (1.651 m)   Wt 164 lb (74.4 kg)   SpO2 94%   BMI 27.29 kg/m   Assessment and Plan: 1. Degenerative disc disease, cervical Follow up with Pain management for repeat ESI  2. Erectile dysfunction, unspecified erectile dysfunction type - tadalafil (CIALIS) 20 MG tablet; Take 0.5-1 tablets (10-20 mg total) by mouth every other day as needed for erectile dysfunction.  Dispense: 10 tablet; Refill: 11   Meds ordered this encounter  Medications  . tadalafil (CIALIS) 20 MG tablet    Sig: Take 0.5-1 tablets (10-20 mg total) by mouth every other day as needed for erectile dysfunction.    Dispense:  10 tablet    Refill:  11    Bari Edward, MD [facility]  10/13/2016

## 2016-11-06 ENCOUNTER — Other Ambulatory Visit: Payer: Self-pay | Admitting: Orthopedic Surgery

## 2016-11-06 DIAGNOSIS — M5030 Other cervical disc degeneration, unspecified cervical region: Secondary | ICD-10-CM

## 2016-11-09 ENCOUNTER — Other Ambulatory Visit: Payer: Self-pay | Admitting: Internal Medicine

## 2016-11-10 ENCOUNTER — Ambulatory Visit
Admission: RE | Admit: 2016-11-10 | Discharge: 2016-11-10 | Disposition: A | Discharge status: Home / Self Care | Payer: Medicaid Other | Source: Ambulatory Visit | Attending: Orthopedic Surgery | Admitting: Orthopedic Surgery

## 2016-11-10 ENCOUNTER — Other Ambulatory Visit: Payer: Self-pay

## 2016-11-10 DIAGNOSIS — M5030 Other cervical disc degeneration, unspecified cervical region: Secondary | ICD-10-CM

## 2016-11-10 MED ORDER — IOPAMIDOL (ISOVUE-M 300) INJECTION 61%
1.0000 mL | Freq: Once | INTRAMUSCULAR | Status: AC | PRN
  Administered 2016-11-10: 1 mL via EPIDURAL

## 2016-11-10 MED ORDER — TRIAMCINOLONE ACETONIDE 40 MG/ML IJ SUSP (RADIOLOGY)
60.0000 mg | Freq: Once | INTRAMUSCULAR | Status: AC
  Administered 2016-11-10: 60 mg via EPIDURAL

## 2016-11-10 NOTE — Telephone Encounter (Signed)
[facility] requesting refill on Tramadol 50 mg. Please advise.

## 2016-11-10 NOTE — Discharge Instructions (Signed)

## 2017-04-14 ENCOUNTER — Encounter: Payer: Medicaid Other | Admitting: Internal Medicine

## 2017-04-14 ENCOUNTER — Ambulatory Visit: Payer: Medicaid Other | Admitting: Internal Medicine

## 2017-06-10 ENCOUNTER — Encounter: Payer: Self-pay | Admitting: Emergency Medicine

## 2017-06-10 ENCOUNTER — Ambulatory Visit: Payer: Medicaid Other

## 2017-06-10 ENCOUNTER — Other Ambulatory Visit: Payer: Self-pay

## 2017-06-10 ENCOUNTER — Ambulatory Visit
Admission: EM | Admit: 2017-06-10 | Discharge: 2017-06-10 | Disposition: A | Discharge status: Home / Self Care | Payer: Medicaid Other | Attending: Family Medicine | Admitting: Family Medicine

## 2017-06-10 DIAGNOSIS — Z79899 Other long term (current) drug therapy: Secondary | ICD-10-CM | POA: Insufficient documentation

## 2017-06-10 DIAGNOSIS — N529 Male erectile dysfunction, unspecified: Secondary | ICD-10-CM | POA: Diagnosis not present

## 2017-06-10 DIAGNOSIS — K589 Irritable bowel syndrome without diarrhea: Secondary | ICD-10-CM | POA: Insufficient documentation

## 2017-06-10 DIAGNOSIS — W010XXA Fall on same level from slipping, tripping and stumbling without subsequent striking against object, initial encounter: Secondary | ICD-10-CM | POA: Insufficient documentation

## 2017-06-10 DIAGNOSIS — M25512 Pain in left shoulder: Secondary | ICD-10-CM | POA: Diagnosis present

## 2017-06-10 DIAGNOSIS — W19XXXA Unspecified fall, initial encounter: Secondary | ICD-10-CM

## 2017-06-10 DIAGNOSIS — F17210 Nicotine dependence, cigarettes, uncomplicated: Secondary | ICD-10-CM | POA: Insufficient documentation

## 2017-06-10 DIAGNOSIS — S32038A Other fracture of third lumbar vertebra, initial encounter for closed fracture: Secondary | ICD-10-CM | POA: Insufficient documentation

## 2017-06-10 DIAGNOSIS — S46912A Strain of unspecified muscle, fascia and tendon at shoulder and upper arm level, left arm, initial encounter: Secondary | ICD-10-CM | POA: Diagnosis not present

## 2017-06-10 DIAGNOSIS — S39012A Strain of muscle, fascia and tendon of lower back, initial encounter: Secondary | ICD-10-CM

## 2017-06-10 DIAGNOSIS — E782 Mixed hyperlipidemia: Secondary | ICD-10-CM | POA: Insufficient documentation

## 2017-06-10 DIAGNOSIS — M545 Low back pain: Secondary | ICD-10-CM | POA: Diagnosis present

## 2017-06-10 DIAGNOSIS — S32030A Wedge compression fracture of third lumbar vertebra, initial encounter for closed fracture: Secondary | ICD-10-CM

## 2017-06-10 MED ORDER — TRAMADOL HCL 50 MG PO TABS: 50.0000 mg | ORAL_TABLET | Freq: Four times a day (QID) | ORAL | 0 refills | Status: DC | PRN

## 2017-06-10 NOTE — ED Triage Notes (Signed)
Patient states that he was chasing a soccer ball and fell.  Patient c/o left shoulder, left knee, and back pain.

## 2017-06-10 NOTE — ED Provider Notes (Signed)
[facility]    CSN: [id number] Arrival date & time: 06/10/17  1215     History   Chief Complaint Chief Complaint  Patient presents with  . Back Pain  . Shoulder Pain    left  . Fall    HPI Jeffrey Gill is a 77 y.o. male.   77 yo male with a c/o left shoulder pain and low back pain after falling yesterday. States he was by the ride trying to get a soccer ball when he tripped and fell landing mainly on his left side. Denies any numbness/tingling.    The history is provided by the patient.    History reviewed. No pertinent past medical history.  Patient Active Problem List   Diagnosis Date Noted  . ED (erectile dysfunction) 03/31/2016  . Degenerative disc disease, cervical 03/24/2016  . Triceps tendonitis 09/05/2015  . Other seasonal allergic rhinitis 03/23/2015  . Episodic tension-type headache, not intractable 03/23/2015  . 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 Hyperlipidemia, mixed 09/17/2014  . 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 Compulsive tobacco user syndrome 09/17/2014    Past Surgical History:  Procedure Laterality Date  . INTRACAPSULAR CATARACT EXTRACTION Bilateral        Home Medications    Prior to Admission medications   Medication Sig Start Date End Date Taking? Authorizing Provider  Multiple Vitamins-Minerals (MULTIVITAMIN ADULTS 50+) TABS Take 1 tablet by mouth daily. Reported on 08/16/2015   Yes [provider]  Omega-3 Fatty Acids (FISH OIL BURP-LESS) 1200 MG CAPS Take 1 capsule by mouth daily. Reported on 08/16/2015   Yes [provider]  omeprazole (PRILOSEC) 10 MG capsule Take by mouth as needed. 09/23/10  Yes [provider]  pravastatin (PRAVACHOL) 40 MG tablet TAKE 1 TABLET BY MOUTH EVERY DAY 11/10/16  Yes Reubin Milan, MD  tadalafil (CIALIS) 20 MG tablet Take 0.5-1 tablets (10-20 mg total) by mouth every other day as needed for erectile dysfunction.  10/13/16  Yes Reubin Milan, MD  acetaminophen (TYLENOL) 650 MG CR tablet Take 650 mg by mouth every 8 (eight) hours as needed for pain.    [provider]  traMADol (ULTRAM) 50 MG tablet Take 1 tablet (50 mg total) by mouth every 6 (six) hours as needed. for pain 06/10/17   Payton Mccallum, MD    Family History History reviewed. No pertinent family history.  Social History Social History   Tobacco Use  . Smoking status: Current Every Day Smoker  . Smokeless tobacco: Never Used  Substance Use Topics  . Alcohol use: Yes    Alcohol/week: 0.0 oz  . Drug use: No     Allergies   Patient has no known allergies.   Review of Systems Review of Systems   Physical Exam Triage Vital Signs ED Triage Vitals  Enc Vitals Group     BP 06/10/17 1239 (!) 155/83     Pulse Rate 06/10/17 1239 75     Resp 06/10/17 1239 16     Temp 06/10/17 1239 98.2 F (36.8 C)     Temp Source 06/10/17 1239 Oral     SpO2 06/10/17 1239 95 %     Weight 06/10/17 1236 164 lb (74.4 kg)     Height 06/10/17 1236 5\' 6"  (1.676 m)     Head Circumference --      Peak Flow --      Pain Score 06/10/17 1236 5     Pain Loc --  Pain Edu? --      Excl. in GC? --    No data found.  Updated Vital Signs BP (!) 155/83 (BP Location: Right Arm)   Pulse 75   Temp 98.2 F (36.8 C) (Oral)   Resp 16   Ht 5\' 6"  (1.676 m)   Wt 164 lb (74.4 kg)   SpO2 95%   BMI 26.47 kg/m   Visual Acuity Right Eye Distance:   Left Eye Distance:   Bilateral Distance:    Right Eye Near:   Left Eye Near:    Bilateral Near:     Physical Exam  Constitutional: He appears well-developed and well-nourished. No distress.  Neck: Normal range of motion. Neck supple. No tracheal deviation present.  Cardiovascular: Normal rate, regular rhythm and normal heart sounds.  Pulmonary/Chest: Effort normal and breath sounds normal. No stridor. No respiratory distress.  Musculoskeletal:       Left shoulder: He exhibits decreased  range of motion, tenderness, bony tenderness and pain. He exhibits no swelling, no effusion, no crepitus, no deformity, no laceration, no spasm, normal pulse and normal strength.       Cervical back: He exhibits normal range of motion, no tenderness, no bony tenderness, no swelling, no edema, no deformity, no laceration, no pain, no spasm and normal pulse.       Lumbar back: He exhibits tenderness and bony tenderness. He exhibits normal range of motion, no swelling, no edema, no deformity, no laceration, no pain, no spasm and normal pulse.  Neurological: He is alert. He has normal reflexes. He displays normal reflexes. He exhibits normal muscle tone. Coordination normal.  Skin: No rash noted. He is not diaphoretic.  Nursing note and vitals reviewed.    UC Treatments / Results  Labs (all labs ordered are listed, but only abnormal results are displayed) Labs Reviewed - No data to display  EKG  EKG Interpretation None       Radiology Dg Lumbar Spine Complete  Result Date: 06/10/2017 CLINICAL DATA:  Pt states he fell yesterday and injured left shoulder and lower lumbar area. Most pain across lower lumbar and left prox humeral area. EXAM: LUMBAR SPINE - COMPLETE 4+ VIEW COMPARISON:  None. FINDINGS: There are 5 nonrib bearing lumbar-type vertebral bodies. There is mild cortical buckling along the anterior L3 vertebral body suggestive of a mild compression fracture of indeterminate age. There is generalized osteopenia. The alignment is anatomic. There is no static listhesis. There is no spondylolysis. There is no acute fracture. The disc spaces are maintained. The SI joints are unremarkable. There is abdominal aortic atherosclerosis. IMPRESSION: 1. Mild cortical buckling along the anterior L3 vertebral body suggestive of a mild compression fracture of indeterminate age. Electronically Signed   By: Elige KoHetal  Patel   On: 06/10/2017 13:45   Dg Shoulder Left  Result Date: 06/10/2017 CLINICAL DATA:  Fall  yesterday, left shoulder injury, pain, initial encounter. EXAM: LEFT SHOULDER - 2+ VIEW COMPARISON:  None. FINDINGS: No acute osseous or joint abnormality. Visualized portion of the left chest is unremarkable. IMPRESSION: No acute osseous or joint abnormality. Electronically Signed   By: Leanna BattlesMelinda  Blietz M.D.   On: 06/10/2017 13:43    Procedures Procedures (including critical care time)  Medications Ordered in UC Medications - No data to display   Initial Impression / Assessment and Plan / UC Course  I have reviewed the triage vital signs and the nursing notes.  Pertinent labs & imaging results that were available during my care of  the patient were reviewed by me and considered in my medical decision making (see chart for details).       Final Clinical Impressions(s) / UC Diagnoses   Final diagnoses:  Strain of left shoulder, initial encounter  Strain of lumbar region, initial encounter  Closed compression fracture of third lumbar vertebra, initial encounter [facility])    ED Discharge Orders        Ordered    traMADol (ULTRAM) 50 MG tablet  Every 6 hours PRN     06/10/17 1406     1. X-ray results and diagnosis reviewed with patient 2. rx as per orders above; reviewed possible side effects, interactions, risks and benefits  3. Recommend supportive treatment with rest, ice 4. Follow-up prn if symptoms worsen or don't improve  Controlled Substance Prescriptions [place] Controlled Substance Registry consulted? Not Applicable   Payton Mccallum, MD 06/10/17 [phone]

## 2017-06-13 ENCOUNTER — Telehealth: Payer: Self-pay

## 2017-06-13 NOTE — Telephone Encounter (Signed)
Attempt to call pt for f/u. Received VM. Mailbox is full and unable to leave a VMM at this time.

## 2017-09-26 ENCOUNTER — Other Ambulatory Visit: Payer: Self-pay | Admitting: Internal Medicine

## 2018-02-12 IMAGING — MR MR CERVICAL SPINE W/O CM
5 series · 34 of 48 positions shown · non-contrast
Comparison: None.

CLINICAL DATA: Headaches, neck pain and right arm pain. Symptoms
for 6 is 7 months.

EXAM:
MRI CERVICAL SPINE WITHOUT CONTRAST
TECHNIQUE: Multiplanar, multisequence MR imaging of the cervical spine was
performed. No intravenous contrast was administered.

[Series 2: T2 · sagittal · 3.0mm · 0.56mm/px · 8 of 13 slices shown (1 of 2)]
[im 1/13]
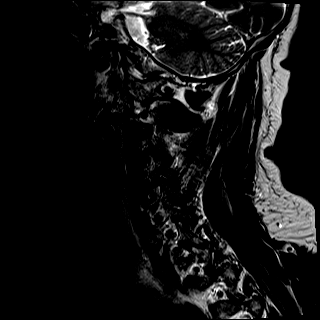
[im 2/13]
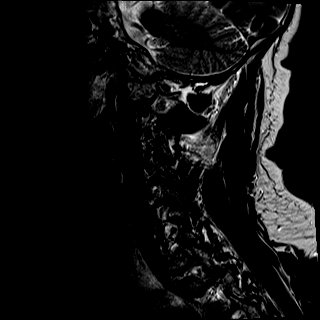
[im 4/13]
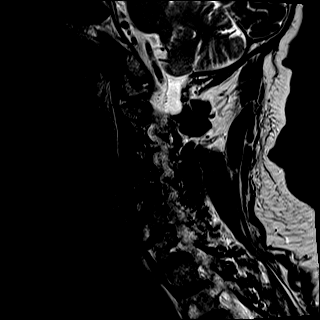
[im 6/13]
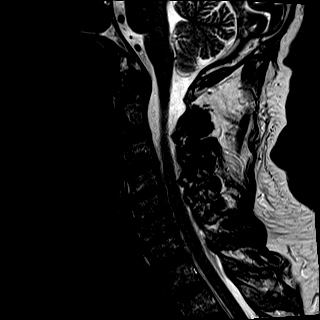
[im 7/13]
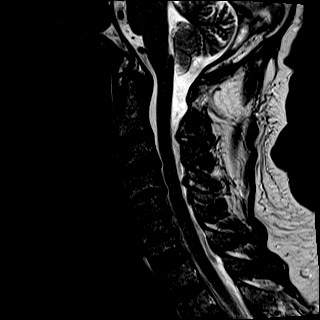
[im 9/13]
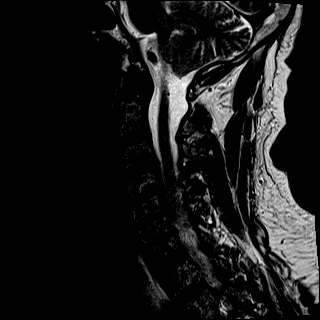
[im 11/13]
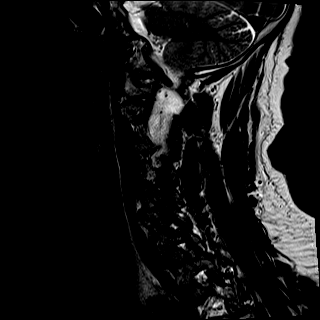
[im 13/13]
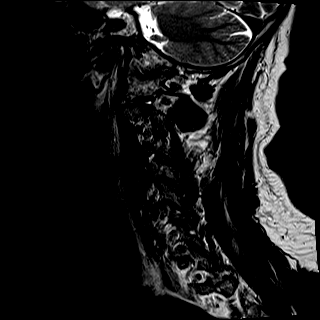

[Series 3: T1 · sagittal · 3.0mm · 0.70mm/px · 7 of 13 slices shown]
[im 1/13]
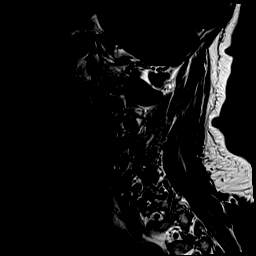
[im 3/13]
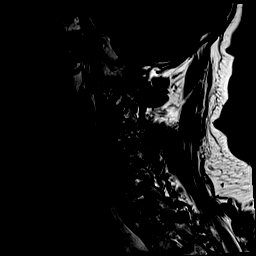
[im 5/13]
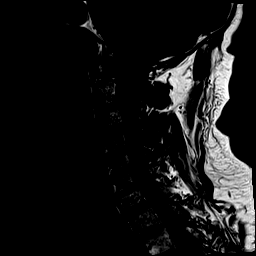
[im 7/13]
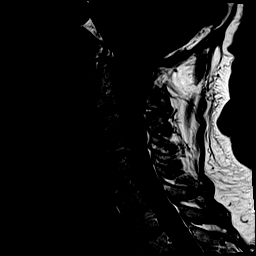
[im 9/13]
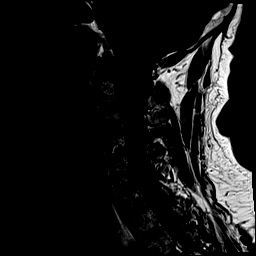
[im 11/13]
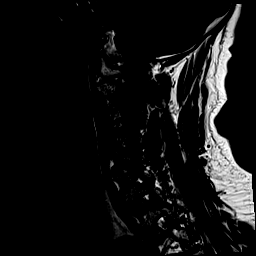
[im 13/13]
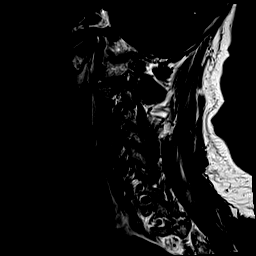

[Series 4: STIR · sagittal · 3.0mm · 0.35mm/px · 7 of 13 slices shown]
[im 1/13]
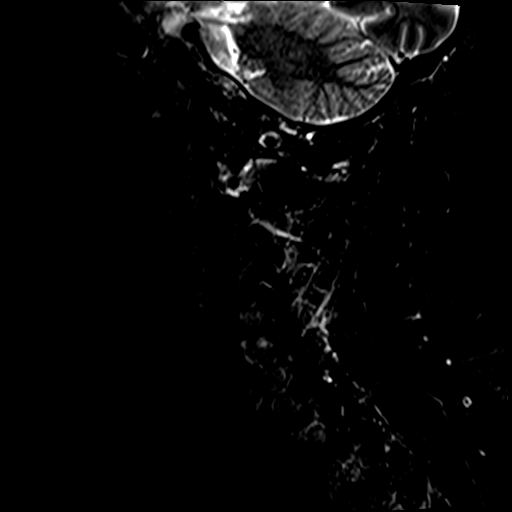
[im 3/13]
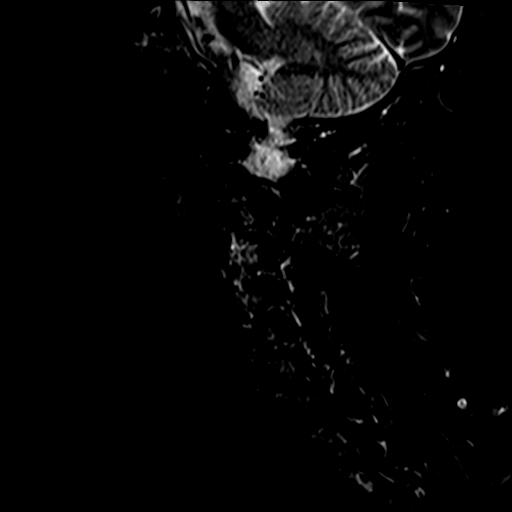
[im 5/13]
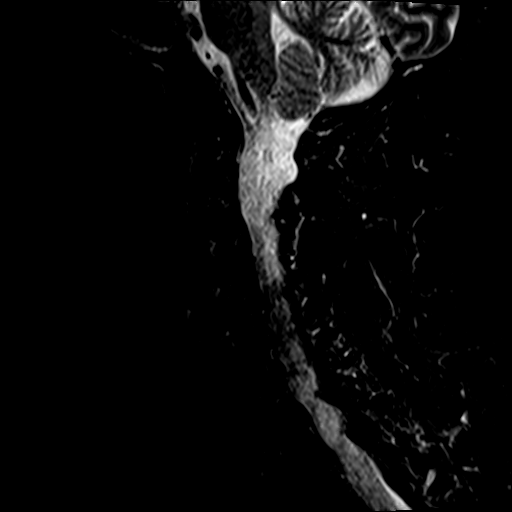
[im 7/13]
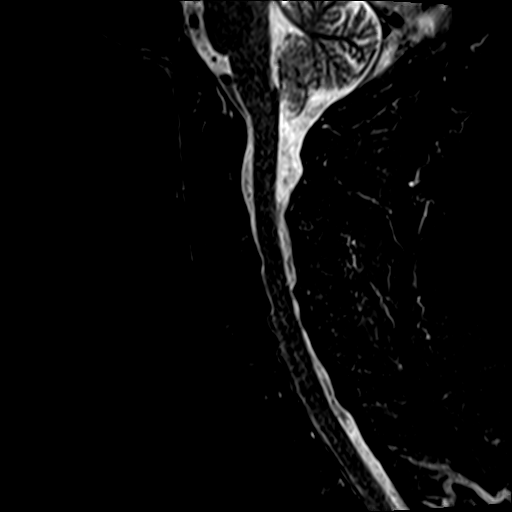
[im 9/13]
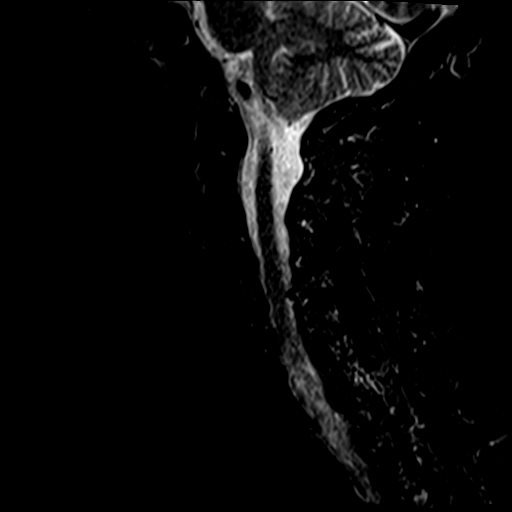
[im 11/13]
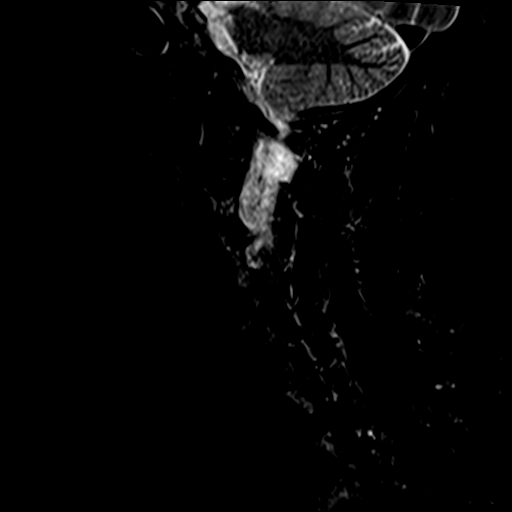
[im 13/13]
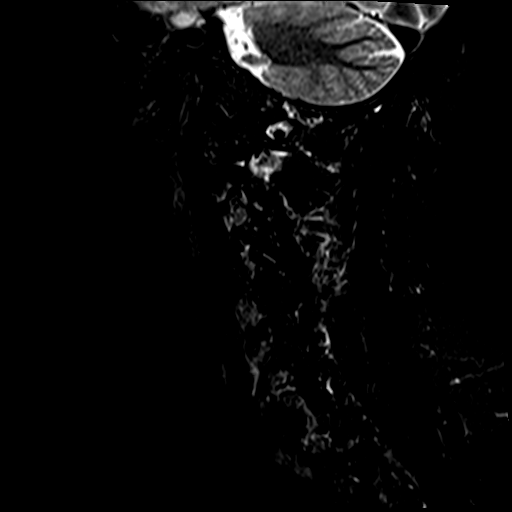

[Series 5: T2 · axial · 3.0mm · 0.62mm/px · z∈[-92,-5]mm · 9 of 25 slices shown (2 of 2)]
[im 1/25]
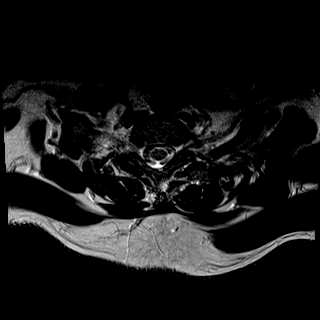
[im 5/25]
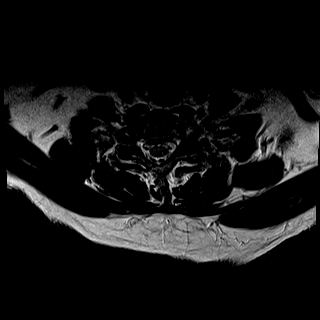
[im 9/25]
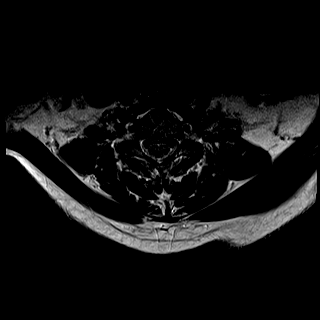
[im 11/25]
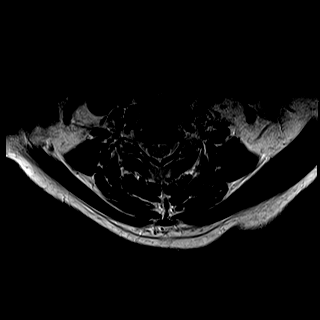
[im 13/25]
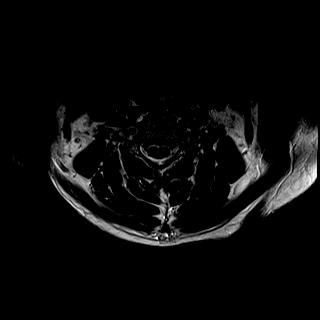
[im 15/25]
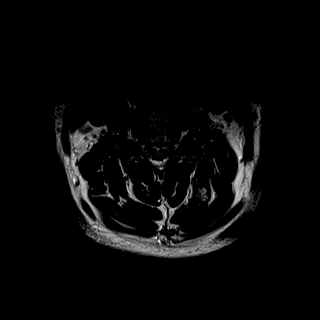
[im 17/25]
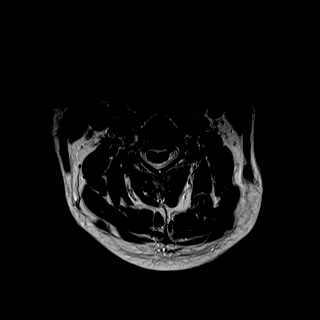
[im 21/25]
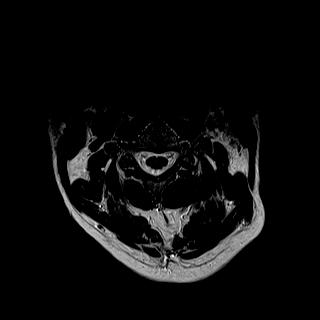
[im 25/25]
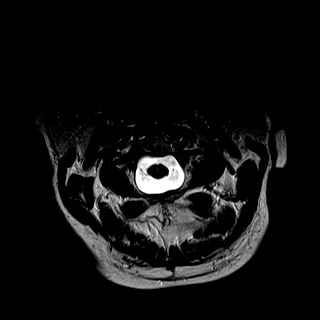

[Series 6: mpgr ax · axial · 3.0mm · 0.35mm/px · z∈[-81,-52]mm · 3 of 25 slices shown]
[im 1/25]
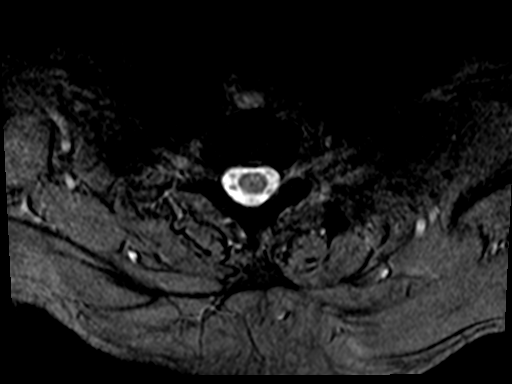
[im 5/25]
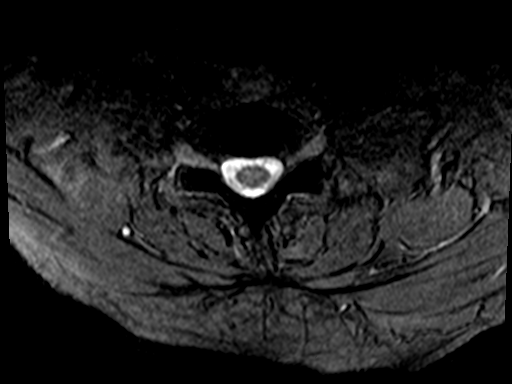
[im 9/25]
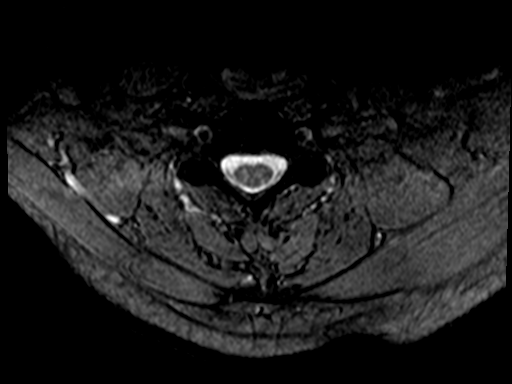

[34 of 48 positions shown; findings below may reference images not displayed]

FINDINGS: Alignment: Normal overall alignment. Multilevel minimal degenerative
subluxations are noted.

Vertebrae: Endplate reactive changes but no fracture or bone lesion.

Cord: Normal signal intensity.  No cord lesions or syrinx.

Posterior Fossa, vertebral arteries, paraspinal tissues: No
significant findings.

Disc levels:

C2-3: No significant findings.

C3-4: Shallow central disc protrusion with mild impression on the
ventral thecal sac. Mild narrowing of the ventral CSF space. No
spinal or foraminal stenosis.

C4-5: Bulging degenerated annulus and osteophytic ridging causing
flattening of the ventral thecal sac and narrowing the ventral CSF
space. There is mild foraminal stenosis bilaterally, right slightly
greater than left.

C5-6: Bulging annulus, osteophytic ridging and uncinate spurring
with mass effect on the ventral thecal sac and narrowing of the
ventral CSF space. Minimal foraminal stenosis bilaterally.

C6-7: Bulging annulus and osteophytic ridging but no spinal or
foraminal stenosis.

C7-T1:  No significant findings.
IMPRESSION: 1. Degenerative cervical spondylosis with multilevel disc disease
and facet disease.
2. Shallow disc protrusion at C3-4 without significant neural
compression.
3. Bulging degenerated discs, osteophytic ridging and uncinate
spurring at C4-5, C5-6 and C6-7 but no significant spinal or
foraminal stenosis. Mild foraminal encroachment bilaterally at C4-5,
right greater than left and minimal foraminal encroachment
bilaterally at C5-6.

## 2018-04-08 ENCOUNTER — Other Ambulatory Visit: Payer: Self-pay

## 2018-04-08 ENCOUNTER — Ambulatory Visit
Admission: EM | Admit: 2018-04-08 | Discharge: 2018-04-08 | Disposition: A | Discharge status: Home / Self Care | Payer: Medicaid Other | Attending: Family Medicine | Admitting: Family Medicine

## 2018-04-08 ENCOUNTER — Ambulatory Visit: Payer: Medicaid Other

## 2018-04-08 DIAGNOSIS — J069 Acute upper respiratory infection, unspecified: Secondary | ICD-10-CM | POA: Diagnosis not present

## 2018-04-08 DIAGNOSIS — Z79899 Other long term (current) drug therapy: Secondary | ICD-10-CM | POA: Insufficient documentation

## 2018-04-08 DIAGNOSIS — R531 Weakness: Secondary | ICD-10-CM | POA: Diagnosis present

## 2018-04-08 DIAGNOSIS — R05 Cough: Secondary | ICD-10-CM | POA: Diagnosis present

## 2018-04-08 DIAGNOSIS — R509 Fever, unspecified: Secondary | ICD-10-CM | POA: Diagnosis present

## 2018-04-08 DIAGNOSIS — Z87891 Personal history of nicotine dependence: Secondary | ICD-10-CM | POA: Diagnosis not present

## 2018-04-08 HISTORY — DX: Hyperlipidemia, unspecified: E78.5

## 2018-04-08 LAB — URINALYSIS, COMPLETE (UACMP) WITH MICROSCOPIC
Bacteria, UA: NONE SEEN
Glucose, UA: NEGATIVE mg/dL
Ketones, ur: NEGATIVE mg/dL
Leukocytes, UA: NEGATIVE
Nitrite: NEGATIVE
Specific Gravity, Urine: 1.015 (ref 1.005–1.030)
pH: 5.5 (ref 5.0–8.0)

## 2018-04-08 MED ORDER — BENZONATATE 200 MG PO CAPS: ORAL_CAPSULE | ORAL | 0 refills | Status: AC

## 2018-04-08 MED ORDER — AZITHROMYCIN 250 MG PO TABS: 250.0000 mg | ORAL_TABLET | Freq: Every day | ORAL | 0 refills | Status: AC

## 2018-04-08 MED ORDER — ALBUTEROL SULFATE HFA 108 (90 BASE) MCG/ACT IN AERS: 1.0000 | INHALATION_SPRAY | Freq: Four times a day (QID) | RESPIRATORY_TRACT | 0 refills | Status: AC | PRN

## 2018-04-08 MED ORDER — GUAIFENESIN-CODEINE 100-10 MG/5ML PO SYRP: 5.0000 mL | ORAL_SOLUTION | Freq: Three times a day (TID) | ORAL | 0 refills | Status: AC | PRN

## 2018-04-08 MED ORDER — IPRATROPIUM-ALBUTEROL 0.5-2.5 (3) MG/3ML IN SOLN
3.0000 mL | Freq: Once | RESPIRATORY_TRACT | Status: AC
  Administered 2018-04-08: 3 mL via RESPIRATORY_TRACT

## 2018-04-08 NOTE — Discharge Instructions (Addendum)
Do not drive or perform activities requiring concentration or judgment while taking the cough syrup.  Your symptoms worsen or you are not improving recommend going to the emergency room.

## 2018-04-08 NOTE — ED Provider Notes (Signed)
[facility]    CSN: [id number] Arrival date & time: 04/08/18  0825     History   Chief Complaint Chief Complaint  Patient presents with  . Cough    HPI Jeffrey Gill is a 77 y.o. male.   HPI  77 year old male accompanied by his male companion Modena JanskyZentz with a 6-day history of severe cough fever chills body aches and weakness.  Patient states the symptoms seem to improve after 2 days but has returned and now worsened.  He has pain in his chest and back as well as his abdomen with the coughing.  It is productive of yellow-green sputum.  It is kept him up all night with the coughing.  This is despite using Delsym every 8 hours or so.  Companion also states that he has been complaining of a hematuria he attributed to the Delsym cough syrup.  Does not complain of dysuria urgency or frequency however.        Past Medical History:  Diagnosis Date  . Hyperlipidemia     Patient Active Problem List   Diagnosis Date Noted  . ED (erectile dysfunction) 03/31/2016  . Degenerative disc disease, cervical 03/24/2016  . Triceps tendonitis 09/05/2015  . Other seasonal allergic rhinitis 03/23/2015  . Episodic tension-type headache, not intractable 03/23/2015  . 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 Hyperlipidemia, mixed 09/17/2014  . 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 Compulsive tobacco user syndrome 09/17/2014    Past Surgical History:  Procedure Laterality Date  . INTRACAPSULAR CATARACT EXTRACTION Bilateral        Home Medications    Prior to Admission medications   Medication Sig Start Date End Date Taking? Authorizing Provider  acetaminophen (TYLENOL) 650 MG CR tablet Take 650 mg by mouth every 8 (eight) hours as needed for pain.   Yes [provider]  Multiple Vitamins-Minerals (MULTIVITAMIN ADULTS 50+) TABS Take 1 tablet by mouth daily. Reported on 08/16/2015   Yes [provider]  Omega-3 Fatty  Acids (FISH OIL BURP-LESS) 1200 MG CAPS Take 1 capsule by mouth daily. Reported on 08/16/2015   Yes [provider]  pravastatin (PRAVACHOL) 40 MG tablet TAKE 1 TABLET BY MOUTH EVERY DAY 11/10/16  Yes Reubin MilanBerglund, Laura H, MD  tadalafil (CIALIS) 20 MG tablet Take 0.5-1 tablets (10-20 mg total) by mouth every other day as needed for erectile dysfunction. 10/13/16  Yes Reubin MilanBerglund, Laura H, MD  albuterol (PROVENTIL HFA;VENTOLIN HFA) 108 (90 Base) MCG/ACT inhaler Inhale 1-2 puffs into the lungs every 6 (six) hours as needed for wheezing or shortness of breath. Use with spacer 04/08/18   Lutricia Feiloemer, Narmeen Kerper P, PA-C  azithromycin (ZITHROMAX) 250 MG tablet Take 1 tablet (250 mg total) by mouth daily. Take first 2 tablets together, then 1 every day until finished. 04/08/18   Lutricia Feiloemer, Lauranne Beyersdorf P, PA-C  benzonatate (TESSALON) 200 MG capsule Take one cap TID PRN cough 04/08/18   Ovid Curdoemer, Daiya Tamer P, PA-C  guaiFENesin-codeine (CHERATUSSIN AC) 100-10 MG/5ML syrup Take 5 mLs by mouth 3 (three) times daily as needed for cough. 04/08/18   Lutricia Feiloemer, Clemie General P, PA-C    Family History History reviewed. No pertinent family history.  Social History Social History   Tobacco Use  . Smoking status: Former Smoker    Last attempt to quit: 04/08/2017    Years since quitting: 1.0  . Smokeless tobacco: Never Used  Substance Use Topics  . Alcohol use: Yes    Alcohol/week: 0.0 standard drinks  Comment: whiskey daily  . Drug use: No     Allergies   Patient has no known allergies.   Review of Systems Review of Systems  Constitutional: Positive for activity change, appetite change, chills, fatigue and fever.  HENT: Positive for congestion and postnasal drip.   Respiratory: Positive for cough, shortness of breath and wheezing.   All other systems reviewed and are negative.    Physical Exam Triage Vital Signs ED Triage Vitals  Enc Vitals Group     BP 04/08/18 0842 124/66     Pulse Rate 04/08/18 0842 94     Resp  04/08/18 0842 18     Temp 04/08/18 0842 98.8 F (37.1 C)     Temp Source 04/08/18 0842 Oral     SpO2 04/08/18 0842 91 %     Weight 04/08/18 0837 152 lb 1.9 oz (69 kg)     Height 04/08/18 0837 5' 6.54" (1.69 m)     Head Circumference --      Peak Flow --      Pain Score 04/08/18 0836 10     Pain Loc --      Pain Edu? --      Excl. in GC? --    No data found.  Updated Vital Signs BP 124/66 (BP Location: Left Arm)   Pulse (!) 115   Temp 98.8 F (37.1 C) (Oral)   Resp 18   Ht 5' 6.54" (1.69 m)   Wt 152 lb 1.9 oz (69 kg)   SpO2 95%   BMI 24.16 kg/m   Visual Acuity Right Eye Distance:   Left Eye Distance:   Bilateral Distance:    Right Eye Near:   Left Eye Near:    Bilateral Near:     Physical Exam  Constitutional: He is oriented to person, place, and time. He appears well-developed and well-nourished. No distress.  HENT:  Head: Normocephalic.  Right Ear: External ear normal.  Left Ear: External ear normal.  Nose: Nose normal.  Mouth/Throat: Oropharynx is clear and moist. No oropharyngeal exudate.  Eyes: Pupils are equal, round, and reactive to light. Right eye exhibits no discharge. Left eye exhibits no discharge.  Neck: Normal range of motion.  Pulmonary/Chest: Effort normal. He has rales.  Patient coughs incessantly examining room.  Bibasilar crackles that are non-tussive.  Musculoskeletal: Normal range of motion.  Lymphadenopathy:    He has no cervical adenopathy.  Neurological: He is alert and oriented to person, place, and time.  Skin: Skin is warm and dry. He is not diaphoretic.  Psychiatric: He has a normal mood and affect. His behavior is normal. Judgment and thought content normal.  Nursing note and vitals reviewed.    UC Treatments / Results  Labs (all labs ordered are listed, but only abnormal results are displayed) Labs Reviewed  URINALYSIS, COMPLETE (UACMP) WITH MICROSCOPIC - Abnormal; Notable for the following components:      Result Value    Color, Urine AMBER (*)    Hgb urine dipstick TRACE (*)    Bilirubin Urine SMALL (*)    Protein, ur TRACE (*)    All other components within normal limits    EKG None  Radiology Dg Chest 2 View  Result Date: 04/08/2018 CLINICAL DATA:  Productive cough, fever. EXAM: CHEST - 2 VIEW COMPARISON:  Radiographs of March 13, 2014. FINDINGS: The heart size and mediastinal contours are within normal limits. Both lungs are clear. No pneumothorax or pleural effusion is noted. The visualized  skeletal structures are unremarkable. IMPRESSION: No active cardiopulmonary disease. Electronically Signed   By: Lupita Raider, M.D.   On: 04/08/2018 09:39    Procedures Procedures (including critical care time)  Medications Ordered in UC Medications  ipratropium-albuterol (DUONEB) 0.5-2.5 (3) MG/3ML nebulizer solution 3 mL (3 mLs Nebulization Given 04/08/18 0907)    Initial Impression / Assessment and Plan / UC Course  I have reviewed the triage vital signs and the nursing notes.  Pertinent labs & imaging results that were available during my care of the patient were reviewed by me and considered in my medical decision making (see chart for details).   Discussed x-rays with the patient and his companion.  Not have any significant effusions and no active cardiopulmonary disease.  Urinalysis was normal with no evidence of any bleed.  Start him on azithromycin since he has had the cough for so long.  In addition I will treat him for with cough suppressants.  We will give him an albuterol inhaler for shortness of breath.  If he worsens he should go to the emergency room.  Otherwise I have asked him to find a PCP that he may follow-up with.   Final Clinical Impressions(s) / UC Diagnoses   Final diagnoses:  Upper respiratory tract infection, unspecified type     Discharge Instructions     Do not drive or perform activities requiring concentration or judgment while taking the cough syrup.  Your symptoms  worsen or you are not improving recommend going to the emergency room.    ED Prescriptions    Medication Sig Dispense Auth. Provider   azithromycin (ZITHROMAX) 250 MG tablet Take 1 tablet (250 mg total) by mouth daily. Take first 2 tablets together, then 1 every day until finished. 6 tablet Ovid Curd P, PA-C   albuterol (PROVENTIL HFA;VENTOLIN HFA) 108 (90 Base) MCG/ACT inhaler Inhale 1-2 puffs into the lungs every 6 (six) hours as needed for wheezing or shortness of breath. Use with spacer 1 Inhaler Lutricia Feil, PA-C   benzonatate (TESSALON) 200 MG capsule Take one cap TID PRN cough 30 capsule Ovid Curd P, PA-C   guaiFENesin-codeine (CHERATUSSIN AC) 100-10 MG/5ML syrup Take 5 mLs by mouth 3 (three) times daily as needed for cough. 120 mL Lutricia Feil, PA-C     Controlled Substance Prescriptions [place] Controlled Substance Registry consulted? Not Applicable   Lutricia Feil, PA-C 04/08/18 2024

## 2018-04-08 NOTE — ED Triage Notes (Signed)
Patient complains of cough, fever, body aches, weakness. Patient states that this has been ongoing x 6 days, improved after 2 days but symptoms returned and worsened. Patient states that when he coughs he has chest pain and back pain.
